# Patient Record
Sex: Female | Born: 1985 | Race: White | Hispanic: No | Marital: Married | Smoking: Never smoker
Health system: Southern US, Community
[De-identification: ages and names within clinical notes are randomized; demographics above are authoritative.]

## PROBLEM LIST (undated history)

## (undated) ENCOUNTER — Inpatient Hospital Stay: Payer: Self-pay

## (undated) ENCOUNTER — Inpatient Hospital Stay (HOSPITAL_COMMUNITY): Payer: Self-pay

## (undated) DIAGNOSIS — R519 Headache, unspecified: Secondary | ICD-10-CM

## (undated) DIAGNOSIS — K5900 Constipation, unspecified: Secondary | ICD-10-CM

## (undated) DIAGNOSIS — R51 Headache: Secondary | ICD-10-CM

## (undated) DIAGNOSIS — D649 Anemia, unspecified: Secondary | ICD-10-CM

## (undated) DIAGNOSIS — N979 Female infertility, unspecified: Secondary | ICD-10-CM

## (undated) DIAGNOSIS — K219 Gastro-esophageal reflux disease without esophagitis: Secondary | ICD-10-CM

## (undated) DIAGNOSIS — F32A Depression, unspecified: Secondary | ICD-10-CM

## (undated) DIAGNOSIS — F419 Anxiety disorder, unspecified: Secondary | ICD-10-CM

## (undated) DIAGNOSIS — O99210 Obesity complicating pregnancy, unspecified trimester: Secondary | ICD-10-CM

## (undated) DIAGNOSIS — N3090 Cystitis, unspecified without hematuria: Secondary | ICD-10-CM

## (undated) DIAGNOSIS — F329 Major depressive disorder, single episode, unspecified: Secondary | ICD-10-CM

## (undated) DIAGNOSIS — N200 Calculus of kidney: Secondary | ICD-10-CM

## (undated) HISTORY — DX: Gastro-esophageal reflux disease without esophagitis: K21.9

## (undated) HISTORY — PX: DILATION AND CURETTAGE OF UTERUS: SHX78

## (undated) HISTORY — PX: TONSILLECTOMY: SUR1361

## (undated) HISTORY — PX: OTHER SURGICAL HISTORY: SHX169

## (undated) HISTORY — DX: Depression, unspecified: F32.A

## (undated) HISTORY — DX: Major depressive disorder, single episode, unspecified: F32.9

## (undated) HISTORY — DX: Constipation, unspecified: K59.00

## (undated) HISTORY — PX: MYRINGOTOMY WITH TUBE PLACEMENT: SHX5663

## (undated) HISTORY — PX: ADENOIDECTOMY: SUR15

## (undated) HISTORY — DX: Calculus of kidney: N20.0

## (undated) HISTORY — DX: Cystitis, unspecified without hematuria: N30.90

## (undated) HISTORY — DX: Anemia, unspecified: D64.9

---

## 2009-07-16 ENCOUNTER — Ambulatory Visit: Payer: Self-pay | Admitting: Otolaryngology

## 2009-12-05 ENCOUNTER — Ambulatory Visit: Payer: Self-pay | Admitting: Otolaryngology

## 2010-11-12 ENCOUNTER — Ambulatory Visit
Admission: RE | Admit: 2010-11-12 | Discharge: 2010-11-12 | Disposition: A | Discharge status: Home / Self Care | Payer: PRIVATE HEALTH INSURANCE | Source: Ambulatory Visit | Attending: Internal Medicine | Admitting: Internal Medicine

## 2010-11-12 ENCOUNTER — Other Ambulatory Visit: Payer: Self-pay | Admitting: Internal Medicine

## 2010-11-12 DIAGNOSIS — M542 Cervicalgia: Secondary | ICD-10-CM

## 2011-01-09 ENCOUNTER — Other Ambulatory Visit: Payer: Self-pay | Admitting: *Deleted

## 2011-01-09 DIAGNOSIS — R1011 Right upper quadrant pain: Secondary | ICD-10-CM

## 2011-01-12 ENCOUNTER — Ambulatory Visit
Admission: RE | Admit: 2011-01-12 | Discharge: 2011-01-12 | Disposition: A | Discharge status: Home / Self Care | Payer: PRIVATE HEALTH INSURANCE | Source: Ambulatory Visit | Attending: *Deleted | Admitting: *Deleted

## 2011-01-12 DIAGNOSIS — R1011 Right upper quadrant pain: Secondary | ICD-10-CM

## 2011-02-17 ENCOUNTER — Other Ambulatory Visit (HOSPITAL_COMMUNITY)
Admission: RE | Admit: 2011-02-17 | Discharge: 2011-02-17 | Disposition: A | Discharge status: Home / Self Care | Payer: PRIVATE HEALTH INSURANCE | Source: Ambulatory Visit | Attending: Obstetrics and Gynecology | Admitting: Obstetrics and Gynecology

## 2011-02-17 DIAGNOSIS — Z113 Encounter for screening for infections with a predominantly sexual mode of transmission: Secondary | ICD-10-CM | POA: Insufficient documentation

## 2011-02-17 DIAGNOSIS — Z01419 Encounter for gynecological examination (general) (routine) without abnormal findings: Secondary | ICD-10-CM | POA: Insufficient documentation

## 2011-02-17 DIAGNOSIS — N760 Acute vaginitis: Secondary | ICD-10-CM | POA: Insufficient documentation

## 2012-03-24 ENCOUNTER — Other Ambulatory Visit (HOSPITAL_COMMUNITY)
Admission: RE | Admit: 2012-03-24 | Discharge: 2012-03-24 | Disposition: A | Discharge status: Home / Self Care | Payer: BC Managed Care – PPO | Source: Ambulatory Visit | Attending: Obstetrics and Gynecology | Admitting: Obstetrics and Gynecology

## 2012-03-24 DIAGNOSIS — N760 Acute vaginitis: Secondary | ICD-10-CM | POA: Insufficient documentation

## 2012-03-24 DIAGNOSIS — Z113 Encounter for screening for infections with a predominantly sexual mode of transmission: Secondary | ICD-10-CM | POA: Insufficient documentation

## 2012-03-24 DIAGNOSIS — Z01419 Encounter for gynecological examination (general) (routine) without abnormal findings: Secondary | ICD-10-CM | POA: Insufficient documentation

## 2012-07-01 ENCOUNTER — Other Ambulatory Visit: Payer: Self-pay | Admitting: Otolaryngology

## 2012-07-01 ENCOUNTER — Ambulatory Visit
Admission: RE | Admit: 2012-07-01 | Discharge: 2012-07-01 | Disposition: A | Discharge status: Home / Self Care | Payer: BC Managed Care – PPO | Source: Ambulatory Visit | Attending: Otolaryngology | Admitting: Otolaryngology

## 2012-07-01 DIAGNOSIS — H7200 Central perforation of tympanic membrane, unspecified ear: Secondary | ICD-10-CM

## 2012-07-01 DIAGNOSIS — H6980 Other specified disorders of Eustachian tube, unspecified ear: Secondary | ICD-10-CM

## 2012-07-01 MED ORDER — IOHEXOL 300 MG/ML  SOLN
75.0000 mL | Freq: Once | INTRAMUSCULAR | Status: AC | PRN
  Administered 2012-07-01: 75 mL via INTRAVENOUS

## 2012-07-25 ENCOUNTER — Encounter (INDEPENDENT_AMBULATORY_CARE_PROVIDER_SITE_OTHER): Payer: Self-pay

## 2012-12-01 ENCOUNTER — Encounter (INDEPENDENT_AMBULATORY_CARE_PROVIDER_SITE_OTHER): Payer: Self-pay

## 2013-10-03 ENCOUNTER — Ambulatory Visit
Admission: RE | Admit: 2013-10-03 | Discharge: 2013-10-03 | Disposition: A | Discharge status: Home / Self Care | Payer: 59 | Source: Ambulatory Visit | Attending: Internal Medicine | Admitting: Internal Medicine

## 2013-10-03 ENCOUNTER — Other Ambulatory Visit: Payer: Self-pay | Admitting: Internal Medicine

## 2013-10-03 DIAGNOSIS — R1013 Epigastric pain: Secondary | ICD-10-CM

## 2014-01-16 ENCOUNTER — Ambulatory Visit (INDEPENDENT_AMBULATORY_CARE_PROVIDER_SITE_OTHER): Payer: 59 | Admitting: *Deleted

## 2014-01-16 ENCOUNTER — Ambulatory Visit: Payer: 59

## 2014-01-16 DIAGNOSIS — R110 Nausea: Secondary | ICD-10-CM

## 2014-01-16 LAB — HCG, QUANTITATIVE, PREGNANCY: hCG, Beta Chain, Quant, S: 0.32 m[IU]/mL

## 2014-06-13 ENCOUNTER — Inpatient Hospital Stay (HOSPITAL_COMMUNITY)
Admission: AD | Admit: 2014-06-13 | Discharge: 2014-06-13 | Disposition: A | Discharge status: Home / Self Care | Payer: 59 | Source: Ambulatory Visit | Attending: Obstetrics & Gynecology | Admitting: Obstetrics & Gynecology

## 2014-06-13 ENCOUNTER — Other Ambulatory Visit: Payer: Self-pay | Admitting: General Surgery

## 2014-06-13 ENCOUNTER — Other Ambulatory Visit (INDEPENDENT_AMBULATORY_CARE_PROVIDER_SITE_OTHER): Payer: 59

## 2014-06-13 ENCOUNTER — Encounter (HOSPITAL_COMMUNITY): Payer: Self-pay | Admitting: *Deleted

## 2014-06-13 DIAGNOSIS — N939 Abnormal uterine and vaginal bleeding, unspecified: Secondary | ICD-10-CM

## 2014-06-13 DIAGNOSIS — N938 Other specified abnormal uterine and vaginal bleeding: Secondary | ICD-10-CM | POA: Diagnosis present

## 2014-06-13 DIAGNOSIS — R5383 Other fatigue: Secondary | ICD-10-CM

## 2014-06-13 DIAGNOSIS — N949 Unspecified condition associated with female genital organs and menstrual cycle: Secondary | ICD-10-CM | POA: Insufficient documentation

## 2014-06-13 DIAGNOSIS — N898 Other specified noninflammatory disorders of vagina: Secondary | ICD-10-CM

## 2014-06-13 DIAGNOSIS — R5381 Other malaise: Secondary | ICD-10-CM

## 2014-06-13 LAB — CBC
HEMATOCRIT: 37.7 % (ref 36.0–46.0)
Hemoglobin: 12.7 g/dL (ref 12.0–15.0)
MCHC: 33.6 g/dL (ref 30.0–36.0)
MCV: 83.9 fl (ref 78.0–100.0)
Platelets: 524 10*3/uL — ABNORMAL HIGH (ref 150.0–400.0)
RBC: 4.49 Mil/uL (ref 3.87–5.11)
RDW: 14.4 % (ref 11.5–15.5)
WBC: 8 10*3/uL (ref 4.0–10.5)

## 2014-06-13 LAB — URINALYSIS, ROUTINE W REFLEX MICROSCOPIC
BILIRUBIN URINE: NEGATIVE
Glucose, UA: NEGATIVE mg/dL
Ketones, ur: NEGATIVE mg/dL
Nitrite: NEGATIVE
Protein, ur: 30 mg/dL — AB
Specific Gravity, Urine: 1.01 (ref 1.005–1.030)
UROBILINOGEN UA: 0.2 mg/dL (ref 0.0–1.0)
pH: 6 (ref 5.0–8.0)

## 2014-06-13 LAB — URINE MICROSCOPIC-ADD ON

## 2014-06-13 LAB — POCT PREGNANCY, URINE: Preg Test, Ur: NEGATIVE

## 2014-06-13 MED ORDER — MEDROXYPROGESTERONE ACETATE 10 MG PO TABS: 10.0000 mg | ORAL_TABLET | Freq: Every day | ORAL | Status: DC

## 2014-06-13 NOTE — MAU Provider Note (Signed)
History     CSN: [id number]  Arrival date and time: 06/13/14 1611   None     Chief Complaint  Patient presents with  . Vaginal Bleeding   HPI  Ms. Kayla Shelton is a 28 y.o. female G0P0 who presents with abnormal vaginal bleeding. She has had off again and on again bleeding for 6 months. She has been bleeding for 2 weeks; some bleeding not always heavy. She is currently changing 2-3 pads per day. Not currently taking birth control however had been on birth control pills for 10 years. She desires pregnancy.  She has been seeing Dr. Nelda Marseille, however does not desires a change in GYN Dr.  She is scheduled on 11/4 with Dr. Julien Girt. She is concerned most about the length of time she has been bleeding; she desires pregnancy and wants to know if this will interfere with her getting pregnant.   CBC was done at another facility.   OB History   Grav Para Term Preterm Abortions TAB SAB Ect Mult Living                  History reviewed. No pertinent past medical history.  Past Surgical History  Procedure Laterality Date  . Tonsillectomy    . Hematoma on neck removed      No family history on file.  History  Substance Use Topics  . Smoking status: Never Smoker   . Smokeless tobacco: Not on file  . Alcohol Use: Yes     Comment: one drink a month    Allergies:  Allergies  Allergen Reactions  . Sulfa Antibiotics Swelling    Prescriptions prior to admission  Medication Sig Dispense Refill  . amoxicillin-clavulanate (AUGMENTIN) 875-125 MG per tablet Take 1 tablet by mouth 2 (two) times daily. Used for ear infection.  Medication completed on 06/09/14.      . calcium carbonate (TUMS - DOSED IN MG ELEMENTAL CALCIUM) 500 MG chewable tablet Chew 2 tablets by mouth daily as needed for indigestion or heartburn.      . cetirizine (ZYRTEC) 10 MG tablet Take 10 mg by mouth at bedtime.      . Chromium 200 MCG CAPS Take 2 capsules by mouth daily. Over the counter Dietary supplement.  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      Marland Kitchen GARCINIA CAMBOGIA-CHROMIUM PO Take 1 packet by mouth daily. 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      Marland Kitchen ibuprofen (ADVIL,MOTRIN) 200 MG tablet Take 600 mg by mouth every 6 (six) hours as needed for moderate pain.      . metFORMIN (GLUCOPHAGE) 500 MG tablet Take 500 mg by mouth daily with breakfast.      . naproxen sodium (ALEVE) 220 MG tablet Take 440 mg by mouth daily.       Results for orders placed during the hospital encounter of 06/13/14 (from the past 48 hour(s))  POCT PREGNANCY, URINE     Status: None   Collection Time    06/13/14  6:44 PM      Result Value Ref Range   Preg Test, Ur NEGATIVE  NEGATIVE   Comment:            THE SENSITIVITY OF THIS     METHODOLOGY IS >24 mIU/mL    Review of Systems  Constitutional: Negative for fever and chills.  Gastrointestinal: Positive for abdominal pain (Mild cramping ).   Physical Exam   Blood pressure 133/75, pulse 104, temperature 98.1 F (36.7  C), temperature source Oral, height 5\' 1"  (1.549 m), weight 114.363 kg (252 lb 2 oz).  Physical Exam  Constitutional: She is oriented to person, place, and time. She appears well-developed and well-nourished. No distress.  HENT:  Head: Normocephalic.  Eyes: Pupils are equal, round, and reactive to light.  Neck: Neck supple.  Respiratory: Effort normal.  GI: Soft.  Genitourinary:  Speculum exam: Vagina - Small amount of dark red blood in vaginal canal.  Cervix - +scant active bleeding  Bimanual exam: Cervix closed, no CMT  Uterus non tender, normal size Adnexa non tender, no masses bilaterally Chaperone present for exam.   Musculoskeletal: Normal range of motion.  Neurological: She is alert and oriented to person, place, and time.  Skin: Skin is warm. She is not diaphoretic.  Psychiatric: Her behavior is normal.    MAU Course  Procedures None  MDM UA CBC  Dr. Landry Mellow notified of physician request change.   Assessment and Plan    A: Abnormal vaginal bleeding   P: Discharge home in stable condition RX: Provera Bleeding precautions Return to MAU as needed if symptoms worsen  Darrelyn Hillock Debbi Strandberg, NP 06/13/2014 7:07 PM

## 2014-06-13 NOTE — MAU Note (Signed)
Pt states here for excessive bleeding. Changing pad twice daily. Does use restroom frequently and passes clots then. Passed softball sized clot last pm. Had u/s at Dr. Annie Main office, and had her employer draw cbc today.

## 2014-06-13 NOTE — MAU Note (Signed)
Pt states that vaginal bleeding started 9/17. Irregular bleeding started about a month ago. Started on metformin Friday. Possible PCOS, had a transvaginal ultrasound on 9/24. Passing some clots (varying in size from quarter to small strings). Changing a pad about 2-3 times a day.

## 2014-06-13 NOTE — Discharge Instructions (Signed)
Abnormal Uterine Bleeding Abnormal uterine bleeding can affect women at various stages in life, including teenagers, women in their reproductive years, pregnant women, and women who have reached menopause. Several kinds of uterine bleeding are considered abnormal, including:  Bleeding or spotting between periods.   Bleeding after sexual intercourse.   Bleeding that is heavier or more than normal.   Periods that last longer than usual.  Bleeding after menopause.  Many cases of abnormal uterine bleeding are minor and simple to treat, while others are more serious. Any type of abnormal bleeding should be evaluated by your health care provider. Treatment will depend on the cause of the bleeding. HOME CARE INSTRUCTIONS Monitor your condition for any changes. The following actions may help to alleviate any discomfort you are experiencing:  Avoid the use of tampons and douches as directed by your health care provider.  Change your pads frequently. You should get regular pelvic exams and Pap tests. Keep all follow-up appointments for diagnostic tests as directed by your health care provider.  SEEK MEDICAL CARE IF:   Your bleeding lasts more than 1 week.   You feel dizzy at times.  SEEK IMMEDIATE MEDICAL CARE IF:   You pass out.   You are changing pads every 15 to 30 minutes.   You have abdominal pain.  You have a fever.   You become sweaty or weak.   You are passing large blood clots from the vagina.   You start to feel nauseous and vomit. MAKE SURE YOU:   Understand these instructions.  Will watch your condition.  Will get help right away if you are not doing well or get worse. Document Released: 08/31/2005 Document Revised: 09/05/2013 Document Reviewed: 03/30/2013 ExitCare Patient Information 2015 [employer]. This information is not intended to replace advice given to you by your health care provider. Make sure you discuss any questions you have with your  health care provider.  

## 2014-07-23 DIAGNOSIS — N939 Abnormal uterine and vaginal bleeding, unspecified: Secondary | ICD-10-CM | POA: Insufficient documentation

## 2014-08-22 DIAGNOSIS — N970 Female infertility associated with anovulation: Secondary | ICD-10-CM | POA: Insufficient documentation

## 2014-09-14 NOTE — L&D Delivery Note (Signed)
This patient has no babies on file. SVD at 2058 breech . No signs of life . With pushing and traction fetal head decapitated . Shortly thereafter head delivered . Marland KitchenPlacenta with delayed delivery . RN avulsed cord with traction . Speculum exam with removal of fragmented placenta pieces .PT will go to [facility] OR for evacuation of uterus for retained placenta

## 2014-11-22 ENCOUNTER — Emergency Department: Payer: Self-pay | Admitting: Emergency Medicine

## 2015-04-05 ENCOUNTER — Other Ambulatory Visit: Payer: Self-pay | Admitting: Obstetrics and Gynecology

## 2015-04-05 DIAGNOSIS — Z369 Encounter for antenatal screening, unspecified: Secondary | ICD-10-CM

## 2015-04-22 ENCOUNTER — Ambulatory Visit (HOSPITAL_BASED_OUTPATIENT_CLINIC_OR_DEPARTMENT_OTHER)
Admission: RE | Admit: 2015-04-22 | Discharge: 2015-04-22 | Disposition: A | Discharge status: Home / Self Care | Payer: BLUE CROSS/BLUE SHIELD | Source: Ambulatory Visit | Attending: Obstetrics and Gynecology | Admitting: Obstetrics and Gynecology

## 2015-04-22 ENCOUNTER — Ambulatory Visit
Admission: RE | Admit: 2015-04-22 | Discharge: 2015-04-22 | Disposition: A | Discharge status: Home / Self Care | Payer: BLUE CROSS/BLUE SHIELD | Source: Ambulatory Visit

## 2015-04-22 ENCOUNTER — Ambulatory Visit
Admission: RE | Admit: 2015-04-22 | Discharge: 2015-04-22 | Disposition: A | Discharge status: Home / Self Care | Payer: BLUE CROSS/BLUE SHIELD | Source: Ambulatory Visit | Attending: Obstetrics and Gynecology | Admitting: Obstetrics and Gynecology

## 2015-04-22 VITALS — BP 115/88 | HR 124 | Wt 232.4 lb

## 2015-04-22 DIAGNOSIS — Z36 Encounter for antenatal screening of mother: Secondary | ICD-10-CM | POA: Diagnosis not present

## 2015-04-22 DIAGNOSIS — Z369 Encounter for antenatal screening, unspecified: Secondary | ICD-10-CM

## 2015-04-22 HISTORY — DX: Obesity complicating pregnancy, unspecified trimester: O99.210

## 2015-04-22 HISTORY — DX: Anxiety disorder, unspecified: F41.9

## 2015-04-22 LAB — US OB COMP LESS 14 WKS

## 2015-04-22 NOTE — Progress Notes (Signed)
Referring physician:  [facility] Length of Consultation: 30 minute consultation   Kayla Shelton  was referred to [facility] for genetic counseling to review prenatal screening and testing options.  This note summarizes the information we discussed.    The following screening tests were offered:  First trimester screening, which can include nuchal translucency ultrasound screen and/or first trimester maternal serum marker screening.  The nuchal translucency has approximately an 80% detection rate for Down syndrome and can be positive for other chromosome abnormalities as well as congenital heart defects.  When combined with a maternal serum marker screening, the detection rate is up to 90% for Down syndrome and up to 97% for trisomy 18.     Maternal serum marker screening, a blood test that measures pregnancy proteins, can provide risk assessments for Down syndrome, trisomy 18, and open neural tube defects (spina bifida, anencephaly). Because it does not directly examine the fetus, it cannot positively diagnose or rule out these problems.  Targeted ultrasound uses high frequency sound waves to create an image of the developing fetus.  An ultrasound is often recommended as a routine means of evaluating the pregnancy.  It is also used to screen for fetal anatomy problems (for example, a heart defect) that might be suggestive of a chromosomal or other abnormality.   Should these screening tests indicate an increased concern, then the following diagnostic options would be offered:  The chorionic villus sampling procedure is available for first trimester chromosome analysis.  This involves the withdrawal of a small amount of chorionic villi (tissue from the developing placenta).  Risk of pregnancy loss is estimated to be approximately 1 in 200 to 1 in 100 (0.5 to 1%).  There is approximately a 1% (1 in 100) chance that the CVS chromosome results will be unclear.  Chorionic  villi cannot be tested for neural tube defects.     Amniocentesis involves the removal of a small amount of amniotic fluid from the sac surrounding the fetus with the use of a thin needle inserted through the maternal abdomen and uterus.  Ultrasound guidance is used throughout the procedure.  Fetal cells from amniotic fluid are directly evaluated and > 99.5% of chromosome problems and > 98% of open neural tube defects can be detected. This procedure is generally performed after the 15th week of pregnancy.  The main risks to this procedure include complications leading to miscarriage in less than 1 in 200 cases (0.5%).  As another option for information if the pregnancy is suspected to be an an increased chance for certain chromosome conditions, we also reviewed the availability of cell free fetal DNA testing from maternal blood to determine whether or not the baby may have either Down syndrome, trisomy 60, or trisomy 53.  This test utilizes a maternal blood sample and DNA sequencing technology to isolate circulating cell free fetal DNA from maternal plasma.  The fetal DNA can then be analyzed for DNA sequences that are derived from the three most common chromosomes involved in aneuploidy, chromosomes 13, 18, and 21.  If the overall amount of DNA is greater than the expected level for any of these chromosomes, aneuploidy is suspected.  While we do not consider it a replacement for invasive testing and karyotype analysis, a negative result from this testing would be reassuring, though not a guarantee of a normal chromosome complement for the baby.  An abnormal result is certainly suggestive of an abnormal chromosome complement, though we would still  recommend CVS or amniocentesis to confirm any findings from this testing.  Cystic Fibrosis screening was also discussed with the patient. Cystic fibrosis (CF) is one of the most common genetic conditions in persons of Caucasian ancestry.  This condition occurs in  approximately 1 in 2,500 Caucasian persons and results in thickened secretions in the lungs, digestive, and reproductive systems.  For a baby to be at risk for having CF, both of the parents must be carriers for this condition.  Approximately 1 in 46 Caucasian persons is a carrier for CF.  Current carrier testing looks for the most common mutations in the gene for CF and can detect approximately 90% of carriers in the Caucasian population.  This means that the carrier screening can greatly reduce, but cannot eliminate, the chance for an individual to have a child with CF.  If an individual is found to be a carrier for CF, then carrier testing would be available for the partner. As part of [place] newborn screening profile, all babies born in the state of [place] will have a two-tier screening process.  Specimens are first tested to determine the concentration of immunoreactive trypsinogen (IRT).  The top 5% of specimens with the highest IRT values then undergo DNA testing using a panel of over 40 common CF mutations.   We obtained a detailed family history and pregnancy history. The patient stated that her mother has a history of depression and her father has cardiovascular disease, hypertension and type 2 diabetes.  Similarly, the father of the baby stated that his mother has diabetes and hypertension.  His mother and maternal uncle both have bipolar disorder.  We reviewed that mental health conditions may have strong inherited factors in some families, though the genetic factors are not well understood.  Cardiovascular disease, hypertension and diabetes also have strong inherited components as well as lifestyle factors.  All of these are good to be aware of, but currently no genetic testing is available for them. The remainder of the family history was reported to be unremarkable for birth defects, mental retardation, recurrent pregnancy loss or known chromosome abnormalities.  Kayla Shelton  reported that this is her first pregnancy.  She has had no exposures or complications in this pregnancy that would be expected to increase the risk for birth defects.    After consideration of the options, Kayla Shelton elected to proceed with an ultrasound and to decline formal first trimester screening, CF carrier screening and maternal serum AFP testing for spina bifida.  She desires ultrasound only.  An ultrasound was performed at the time of the visit.  The gestational age was consistent with  12 weeks.  Fetal anatomy could not be assessed due to early gestational age.  Please refer to the ultrasound report for details of that study.  Ms. Basich was encouraged to call with questions or concerns.  We can be contacted at [phone].  Wilburt Finlay, MS, CGC

## 2015-04-23 DIAGNOSIS — Z36 Encounter for antenatal screening of mother: Secondary | ICD-10-CM | POA: Diagnosis not present

## 2015-04-23 DIAGNOSIS — Z3A12 12 weeks gestation of pregnancy: Secondary | ICD-10-CM

## 2015-04-23 NOTE — Progress Notes (Signed)
Kayla Wells, MS, CGC performed an integral service incident to the physician's initial service.  I was physically present in the clinical area and was immediately available to render assistance.   [name] C [name]  

## 2015-06-20 ENCOUNTER — Ambulatory Visit (HOSPITAL_COMMUNITY)
Admission: AD | Admit: 2015-06-20 | Discharge: 2015-06-20 | Disposition: A | Discharge status: Home / Self Care | Payer: BLUE CROSS/BLUE SHIELD | Source: Other Acute Inpatient Hospital | Attending: Obstetrics and Gynecology | Admitting: Obstetrics and Gynecology

## 2015-06-20 ENCOUNTER — Encounter: Payer: Self-pay | Admitting: *Deleted

## 2015-06-20 ENCOUNTER — Observation Stay
Admission: AD | Admit: 2015-06-20 | Discharge: 2015-06-20 | Disposition: A | Discharge status: Nursing Home (Includes ICF) | Payer: BLUE CROSS/BLUE SHIELD | Attending: Obstetrics and Gynecology | Admitting: Obstetrics and Gynecology

## 2015-06-20 DIAGNOSIS — O99342 Other mental disorders complicating pregnancy, second trimester: Secondary | ICD-10-CM | POA: Diagnosis not present

## 2015-06-20 DIAGNOSIS — F28 Other psychotic disorder not due to a substance or known physiological condition: Secondary | ICD-10-CM | POA: Diagnosis not present

## 2015-06-20 DIAGNOSIS — O2690 Pregnancy related conditions, unspecified, unspecified trimester: Secondary | ICD-10-CM | POA: Insufficient documentation

## 2015-06-20 DIAGNOSIS — O98312 Other infections with a predominantly sexual mode of transmission complicating pregnancy, second trimester: Secondary | ICD-10-CM | POA: Insufficient documentation

## 2015-06-20 DIAGNOSIS — O99212 Obesity complicating pregnancy, second trimester: Secondary | ICD-10-CM | POA: Diagnosis not present

## 2015-06-20 DIAGNOSIS — F419 Anxiety disorder, unspecified: Secondary | ICD-10-CM | POA: Diagnosis not present

## 2015-06-20 DIAGNOSIS — O034 Incomplete spontaneous abortion without complication: Secondary | ICD-10-CM | POA: Insufficient documentation

## 2015-06-20 DIAGNOSIS — E282 Polycystic ovarian syndrome: Secondary | ICD-10-CM | POA: Diagnosis not present

## 2015-06-20 DIAGNOSIS — O209 Hemorrhage in early pregnancy, unspecified: Secondary | ICD-10-CM | POA: Diagnosis present

## 2015-06-20 DIAGNOSIS — O3432 Maternal care for cervical incompetence, second trimester: Secondary | ICD-10-CM | POA: Diagnosis present

## 2015-06-20 DIAGNOSIS — F329 Major depressive disorder, single episode, unspecified: Secondary | ICD-10-CM | POA: Insufficient documentation

## 2015-06-20 DIAGNOSIS — A6009 Herpesviral infection of other urogenital tract: Secondary | ICD-10-CM | POA: Diagnosis not present

## 2015-06-20 DIAGNOSIS — Z6841 Body Mass Index (BMI) 40.0 and over, adult: Secondary | ICD-10-CM | POA: Diagnosis not present

## 2015-06-20 DIAGNOSIS — Z3A20 20 weeks gestation of pregnancy: Secondary | ICD-10-CM | POA: Insufficient documentation

## 2015-06-20 DIAGNOSIS — O9989 Other specified diseases and conditions complicating pregnancy, childbirth and the puerperium: Secondary | ICD-10-CM | POA: Diagnosis not present

## 2015-06-20 DIAGNOSIS — Z3A00 Weeks of gestation of pregnancy not specified: Secondary | ICD-10-CM | POA: Insufficient documentation

## 2015-06-20 DIAGNOSIS — O039 Complete or unspecified spontaneous abortion without complication: Secondary | ICD-10-CM

## 2015-06-20 LAB — CHLAMYDIA/NGC RT PCR (ARMC ONLY)
Chlamydia Tr: NOT DETECTED
N GONORRHOEAE: NOT DETECTED

## 2015-06-20 LAB — URINALYSIS COMPLETE WITH MICROSCOPIC (ARMC ONLY)
BACTERIA UA: NONE SEEN
Bilirubin Urine: NEGATIVE
Glucose, UA: 50 mg/dL — AB
Hgb urine dipstick: NEGATIVE
LEUKOCYTES UA: NEGATIVE
NITRITE: NEGATIVE
PH: 6 (ref 5.0–8.0)
PROTEIN: NEGATIVE mg/dL
RBC / HPF: NONE SEEN RBC/hpf (ref 0–5)
SPECIFIC GRAVITY, URINE: 1.019 (ref 1.005–1.030)

## 2015-06-20 LAB — CBC
HEMATOCRIT: 32.7 % — AB (ref 35.0–47.0)
HEMOGLOBIN: 10.9 g/dL — AB (ref 12.0–16.0)
MCH: 28.2 pg (ref 26.0–34.0)
MCHC: 33.5 g/dL (ref 32.0–36.0)
MCV: 84 fL (ref 80.0–100.0)
Platelets: 340 10*3/uL (ref 150–440)
RBC: 3.89 MIL/uL (ref 3.80–5.20)
RDW: 16.3 % — ABNORMAL HIGH (ref 11.5–14.5)
WBC: 13.7 10*3/uL — AB (ref 3.6–11.0)

## 2015-06-20 LAB — WET PREP, GENITAL
CLUE CELLS WET PREP: NONE SEEN
Trich, Wet Prep: NONE SEEN
Yeast Wet Prep HPF POC: NONE SEEN

## 2015-06-20 MED ORDER — HYDROMORPHONE HCL 1 MG/ML IJ SOLN
0.1000 mg | Freq: Once | INTRAMUSCULAR | Status: AC
  Administered 2015-06-20: 0.1 mg via INTRAVENOUS
  Filled 2015-06-20: qty 1

## 2015-06-20 MED ORDER — LACTATED RINGERS IV SOLN
INTRAVENOUS | Status: DC
  Administered 2015-06-20: 19:00:00 via INTRAVENOUS

## 2015-06-20 MED ORDER — SODIUM CHLORIDE 0.9 % IV SOLN
8.0000 mg | Freq: Once | INTRAVENOUS | Status: DC
  Filled 2015-06-20: qty 4

## 2015-06-20 MED ORDER — HYDROMORPHONE HCL 1 MG/ML IJ SOLN
INTRAMUSCULAR | Status: AC
  Filled 2015-06-20: qty 1

## 2015-06-20 MED ORDER — ONDANSETRON HCL 4 MG/2ML IJ SOLN
INTRAMUSCULAR | Status: AC
  Administered 2015-06-20: 4 mg
  Filled 2015-06-20: qty 4

## 2015-06-20 MED ORDER — HYDROMORPHONE HCL 1 MG/ML IJ SOLN: 0.3000 mg | Freq: Once | INTRAMUSCULAR | Status: DC

## 2015-06-20 MED ORDER — HYDROMORPHONE HCL 1 MG/ML IJ SOLN: 0.2000 mg | Freq: Once | INTRAMUSCULAR | Status: DC

## 2015-06-20 MED ORDER — HYDROMORPHONE HCL 1 MG/ML IJ SOLN: 0.2000 mg | Freq: Once | INTRAMUSCULAR | Status: DC | PRN

## 2015-06-20 NOTE — Progress Notes (Signed)
Kayla Shelton is a 29 y.o. G1P0 at [redacted]w[redacted]d by LMP of  01/25/15 and consistent with 1st trimester ultrasound admitted for advanced cervical dilation. Pregnancy conceived on letrozole for PCOS w/ obesity.   Pt seen in clinic today with reports of mild cramping this week and new onset pink spotting today with discharge. No fever, abdominal pain.   On exam, amniotic membranes noted at the cervical os. Cervix was 8-9 cm dilated visually. Dark red blood in vaginal vault. Fetal hearttones by bedside ultrasound 155 bpm.  Bedside abdominal ultrasound with prolapsing membranes, breech presentation, no cervix noted.   Pregnancy complicated by: Obesity: BMI 44. PCOS infertility: Pregnancy conceived with letrozole.  Past Medical History: has a past medical history of Anemia; Anxiety; Depression; Dysmenorrhea; Dyspareunia; and HSV-1 (herpes simplex virus 1) infection. 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 Past Surgical History: has past surgical history that includes Tonsillectomy & Adenoidectomy. Family History: family history includes COPD in her paternal grandmother; Dementia in her maternal grandmother; Diabetes mellitus in her father; Heart disease in her maternal grandfather and paternal grandfather; Hyperlipidemia in her brother, father, and paternal grandmother; Hypertension in her father and maternal grandmother. Social History: reports that she has never smoked. She has never used smokeless tobacco. She reports that she drinks alcohol.    Objective: BP 120/67 mmHg  Pulse 118  Resp 98  Ht 5\' 1"  (1.549 m)  Wt 107.502 kg (237 lb)  BMI 44.80 kg/m2  LMP 01/25/2015     Prenatal labs:  Maternal blood type: A pos  Ab screen: Neg  Pap ascus hpv neg  HIV: Neg  Hep B/RPR Neg/NR Rubella : Immune  VZV : Immune  First trimester Genetic counseling and U/S , declined the 1st trimester screening.04/22/15 Second trimester  (AFP/tetra): Declined, IE:[id number]  Early glucola normal  Cervical exam:  UC:  q3-4 min cramping SVE:   Dilation: 8 Effacement (%): 100 Station: -5 Exam by:: by Dr Leafy Ro in office   Labs: Lab Results  Component Value Date   WBC 8.0 06/13/2014   HGB 12.7 06/13/2014   HCT 37.7 06/13/2014   MCV 83.9 06/13/2014   PLT 524.0* 06/13/2014   Assessment / Plan:  Advanced cervical dilation: Discussion with patient and family included joint decision making, including previable state and high likelihood of having a periviable delivery even if rescue cerclage able to be placed, with no change of survival with delivery at this gestational age. Pt would like to try for heroic measures, while being realistic about the poor outcome. No steroids, mag or tocolytics started.  Vaginal cultures, including ureaplasma, mycoplasma, GC/CT, GBS and wet mount collected. IVF at 170ml/hr CBC pending. Urine culture pending.  Discussed transfer with [facility] at [facility] and MFM fellow to accept transfer Dr. Olene Floss.   Pain: Dilaudid 0.1mg  iv given.   Kayla Shelton 06/20/2015, 7:06 PM

## 2015-06-20 NOTE — OB Triage Note (Signed)
Patient sent from [facility] for transport to [facility] for advanced cervical dilation and bulging membranes. Transport accepted by Dr. Milas Kocher.

## 2015-06-21 ENCOUNTER — Encounter (HOSPITAL_COMMUNITY): Payer: Self-pay | Admitting: *Deleted

## 2015-06-21 ENCOUNTER — Inpatient Hospital Stay (HOSPITAL_COMMUNITY)
Admission: AD | Admit: 2015-06-21 | Discharge: 2015-06-21 | Disposition: A | Discharge status: Home / Self Care | Payer: BLUE CROSS/BLUE SHIELD | Source: Ambulatory Visit | Attending: Obstetrics & Gynecology | Admitting: Obstetrics & Gynecology

## 2015-06-21 DIAGNOSIS — O3430 Maternal care for cervical incompetence, unspecified trimester: Secondary | ICD-10-CM | POA: Diagnosis not present

## 2015-06-21 DIAGNOSIS — O4702 False labor before 37 completed weeks of gestation, second trimester: Secondary | ICD-10-CM | POA: Diagnosis not present

## 2015-06-21 DIAGNOSIS — O3432 Maternal care for cervical incompetence, second trimester: Secondary | ICD-10-CM | POA: Diagnosis not present

## 2015-06-21 DIAGNOSIS — Z3A21 21 weeks gestation of pregnancy: Secondary | ICD-10-CM | POA: Diagnosis not present

## 2015-06-21 DIAGNOSIS — N883 Incompetence of cervix uteri: Secondary | ICD-10-CM | POA: Diagnosis present

## 2015-06-21 HISTORY — DX: Female infertility, unspecified: N97.9

## 2015-06-21 NOTE — MAU Note (Signed)
Pt was seen in [facility] last night, was transported to [facility] for incompetent cervix with membranes "coming out."  Pt was DC'd home today, but is uncomfortable with that decision.  Pt has intermittent waves of abd pain, has light bleeding & some ? LOF.

## 2015-06-21 NOTE — MAU Provider Note (Signed)
History     CSN: [id number]  Arrival date and time: 06/21/15 1508   None     Chief Complaint  Patient presents with  . incompetent cervix    HPI  Kayla Shelton 29 y.o. G1P0 @ [redacted]w[redacted]d presents to the MAU for 2nd opinion of hourglassing membranes and management after being seen at [facility]. She currently receives care at the [facility] in [place]   Past Medical History  Diagnosis Date  . Obesity affecting pregnancy   . Anxiety   . Infertility, female     Past Surgical History  Procedure Laterality Date  . Tonsillectomy    . Hematoma on neck removed    . Adenoidectomy      History reviewed. No pertinent family history.  Social History  Substance Use Topics  . Smoking status: Never Smoker   . Smokeless tobacco: Never Used  . Alcohol Use: No     Comment: discontinued with pregnancy, prior to pregnancy would drink socially    Allergies:  Allergies  Allergen Reactions  . Sulfa Antibiotics Swelling    No prescriptions prior to admission    Review of Systems  Constitutional: Negative for fever.  Gastrointestinal: Negative for abdominal pain.  All other systems reviewed and are negative.  Physical Exam   Blood pressure 113/79, pulse 109, temperature 97.9 F (36.6 C), temperature source Oral, resp. rate 20, last menstrual period 01/25/2015.  Physical Exam  Nursing note and vitals reviewed. Constitutional: She is oriented to person, place, and time. She appears well-developed and well-nourished. No distress.  HENT:  Head: Normocephalic.  Neck: Normal range of motion. Neck supple.  Cardiovascular: Normal rate.   Respiratory: Effort normal. No respiratory distress.  GI: Soft. There is no tenderness.  Musculoskeletal: Normal range of motion. She exhibits no edema or tenderness.  Neurological: She is alert and oriented to person, place, and time.  Skin: Skin is warm and dry.  Psychiatric: She has a normal mood and affect. Her behavior is normal.  Judgment and thought content normal.    MAU Course  Procedures  MDM Dr Harolyn Rutherford notified of pt and she came to speak with her in great detail regarding treatment at this gestational age. Pt will be discharged to home  Assessment and Plan  Advanced cervical dilation/ Threatened preterm delivery  Discharge to home on bedrest. Will follow up with providers at [facility].  Clemmons,Lori Eustis, CNM 06/21/2015, 4:47 PM   Attestation of Attending Supervision of Advanced Practice Provider (PA/CNM/NP): Evaluation and management procedures were performed by the Advanced Practice Provider under my supervision and collaboration.  I have reviewed the Advanced Practice Provider's note and chart, and I agree with the management and plan.  Patient has been observed at [facility] and at evaluated [facility]; she was told the fetus was previable and there were no fetal interventions that could be done.  She was told to return to hospital in one week if still pregnant and may get steroids at that point given that the fetus may be considered periviable then.  Patient was told to be on modified bedrest at home, came here for second opinion. Given that she had already been observed in hospital for 2 days with no progressing PTL, she was told that we are not able to give her a different plan of care from what was established by [facility].  Of note, patient was given the choice of IOL, she declined this at [facility].  Patient was tearful; her family members  who accompanied her were very supportive.  She was discharged to home on modified bedrest and given PTL, bleeding and infection precautions. She will follow up at [facility] clinic or [facility] as previously planned.   Verita Schneiders, MD, [place] Attending [place], [facility]

## 2015-06-21 NOTE — Discharge Instructions (Signed)
Pelvic Rest Pelvic rest is sometimes recommended for women when:   The placenta is partially or completely covering the opening of the cervix (placenta previa).  There is bleeding between the uterine wall and the amniotic sac in the first trimester (subchorionic hemorrhage).  The cervix begins to open without labor starting (incompetent cervix, cervical insufficiency).  The labor is too early (preterm labor). HOME CARE INSTRUCTIONS  Do not have sexual intercourse, stimulation, or an orgasm.  Do not use tampons, douche, or put anything in the vagina.  Do not lift anything over 10 pounds (4.5 kg).  Avoid strenuous activity or straining your pelvic muscles. SEEK MEDICAL CARE IF:  You have any vaginal bleeding during pregnancy. Treat this as a potential emergency.  You have cramping pain felt low in the stomach (stronger than menstrual cramps).  You notice vaginal discharge (watery, mucus, or bloody).  You have a low, dull backache.  There are regular contractions or uterine tightening. SEEK IMMEDIATE MEDICAL CARE IF: You have vaginal bleeding and have placenta previa.    This information is not intended to replace advice given to you by your health care provider. Make sure you discuss any questions you have with your health care provider.   Document Released: 12/26/2010 Document Revised: 11/23/2011 Document Reviewed: 03/04/2015 Elsevier Interactive Patient Education 2016 Elsevier Inc.  Cervical Insufficiency Cervical insufficiency is when the cervix is weak and starts to open (dilate) and thin (efface) before the pregnancy is at term and without labor starting. This is also called incompetent cervix. It can happen in the second or third trimester when the fetus starts putting pressure on the cervix. Cervical insufficiency can lead to a miscarriage, preterm premature rupture of the membranes (PPROM), or having the baby early (preterm birth).  RISK FACTORS You may be more likely  to develop cervical insufficiency if:  You have a shorter cervix than normal.  Damage or injury occurred to your cervix from a past pregnancy or surgery.  You were born with a cervical defect.  You have had a procedure done on the cervix, such as cervical biopsy.  You have a history of cervical insufficiency.  You have a history of PPROM.  You have ended several past pregnancies through abortion.  You were exposed to the drug diethylstilbestrol (DES). SYMPTOMS Often times, women do not have any symptoms. Other times, women may only have mild symptoms that often start between week 14 through 20. The symptoms may last several days or weeks. These symptoms include:  Light spotting or bleeding from the vagina.  Pelvic pressure.  A change in vaginal discharge, such as discharge that changes from clear, white, or light yellow to pink or tan.  Back pain.  Abdominal pain or cramping. DIAGNOSIS Cervical insufficiency cannot be diagnosed before you become pregnant. Once you are pregnant, your health care provider will ask about your medical history and if you have had any problems in past pregnancies. Tell your health care provider about any procedures performed on your cervix or if you have a history of miscarriages or cervical insufficiency. If your health care provider thinks you are at high risk for cervical insufficiency or show signs of cervical insufficiency, he or she may:  Perform a pelvic exam. This will check for:  The presence of the membranes (amniotic sac) coming out of the cervix.  Cervical abnormalities.  Cervical injuries.  The presence of contractions.  Perform an ultrasonography (commonly called ultrasound) to measure the length and thickness of the cervix. TREATMENT  If you have been diagnosed with cervical insufficiency, your health care provider may recommend:  Limiting physical activity.  Bed rest at home or in the hospital.  Pelvic rest, which means no  sexual intercourse or placing anything in the vagina.  Cerclage to sew the cervix closed and prevent it from opening too early. The stitches (sutures) are removed between weeks 36 and 38 to avoid problems during labor. Cerclage may be recommended during pregnancy if you have had a history of miscarriages or preterm births without a known cause. It may also be recommended if you have a short cervix that was identified by ultrasound or if your health care provider has found that your cervix has dilated before 24 weeks of pregnancy. Limiting physical activity and bed rest may or may not help prevent a preterm birth. WHEN SHOULD YOU SEEK IMMEDIATE MEDICAL CARE?  Seek immediate medical care if you show any symptoms of cervical insufficiency. You will need to go to the hospital to get checked immediately.   This information is not intended to replace advice given to you by your health care provider. Make sure you discuss any questions you have with your health care provider.   Document Released: 08/31/2005 Document Revised: 09/21/2014 Document Reviewed: 11/07/2012 Elsevier Interactive Patient Education [employer].

## 2015-06-22 ENCOUNTER — Observation Stay
Admission: EM | Admit: 2015-06-22 | Discharge: 2015-06-23 | Disposition: A | Discharge status: Home / Self Care | Payer: BLUE CROSS/BLUE SHIELD | Attending: Obstetrics and Gynecology | Admitting: Obstetrics and Gynecology

## 2015-06-22 DIAGNOSIS — O4702 False labor before 37 completed weeks of gestation, second trimester: Principal | ICD-10-CM | POA: Insufficient documentation

## 2015-06-22 DIAGNOSIS — E669 Obesity, unspecified: Secondary | ICD-10-CM | POA: Insufficient documentation

## 2015-06-22 DIAGNOSIS — K5900 Constipation, unspecified: Secondary | ICD-10-CM | POA: Insufficient documentation

## 2015-06-22 DIAGNOSIS — Z3A21 21 weeks gestation of pregnancy: Secondary | ICD-10-CM | POA: Insufficient documentation

## 2015-06-22 DIAGNOSIS — O26872 Cervical shortening, second trimester: Secondary | ICD-10-CM | POA: Insufficient documentation

## 2015-06-22 DIAGNOSIS — F419 Anxiety disorder, unspecified: Secondary | ICD-10-CM | POA: Insufficient documentation

## 2015-06-22 DIAGNOSIS — O3432 Maternal care for cervical incompetence, second trimester: Secondary | ICD-10-CM | POA: Diagnosis present

## 2015-06-22 MED ORDER — DOCUSATE SODIUM 100 MG PO CAPS
100.0000 mg | ORAL_CAPSULE | Freq: Two times a day (BID) | ORAL | Status: DC | PRN
  Administered 2015-06-22: 100 mg via ORAL
  Filled 2015-06-22: qty 1

## 2015-06-22 MED ORDER — DIPHENHYDRAMINE HCL 25 MG PO CAPS
ORAL_CAPSULE | ORAL | Status: AC
  Administered 2015-06-22: 25 mg
  Filled 2015-06-22: qty 2

## 2015-06-22 MED ORDER — ACETAMINOPHEN 325 MG PO TABS: 650.0000 mg | ORAL_TABLET | ORAL | Status: DC | PRN

## 2015-06-22 MED ORDER — ZOLPIDEM TARTRATE 5 MG PO TABS: 5.0000 mg | ORAL_TABLET | Freq: Every evening | ORAL | Status: DC | PRN

## 2015-06-22 MED ORDER — DOCUSATE SODIUM 100 MG PO CAPS: 100.0000 mg | ORAL_CAPSULE | Freq: Every day | ORAL | Status: DC

## 2015-06-22 MED ORDER — CALCIUM CARBONATE ANTACID 500 MG PO CHEW: 2.0000 | CHEWABLE_TABLET | ORAL | Status: DC | PRN

## 2015-06-22 MED ORDER — PRENATAL MULTIVITAMIN CH: 1.0000 | ORAL_TABLET | Freq: Every day | ORAL | Status: DC

## 2015-06-22 MED ORDER — DIPHENHYDRAMINE HCL 25 MG PO CAPS
50.0000 mg | ORAL_CAPSULE | Freq: Four times a day (QID) | ORAL | Status: DC | PRN
  Administered 2015-06-22: 25 mg via ORAL

## 2015-06-22 NOTE — OB Triage Provider Note (Addendum)
History     CSN: [id number]  Arrival date and time: 06/22/15 1426   None     No chief complaint on file.  HPI Kayla Shelton is a 29 yo, Caucasian female, G1P0 at 21+1 weeks by exact LMP of 01/25/2015. Pt. Receives prenatal care at [facility] and this pregnancy was conceived with Letrozol for history of PCOS related infertility and obesity.  Pt.was seen at [facility] with mild cramping and spotting on 06/20/15 with findings of advanced cervical dilatation and sent to [facility] for evaluation.  Pt. discharged from [facility] on 06/21/15 with a cervical exam of 3-4cm and hourglass membranes per MFM and given preterm labor precautions.  MFM declared her cervical exam too advanced for a rescue cerclage.  Today, pt. Called on-call midwife with c/o of more pressure and "feeling like something is coming out of her vagina" when she went to the bathroom, and was instructed to come to [facility] for evaluation.    OB History    Gravida Para Term Preterm AB TAB SAB Ectopic Multiple Living   1 0              Past Medical History  Diagnosis Date  . Obesity affecting pregnancy   . Anxiety   . Infertility, female     Past Surgical History  Procedure Laterality Date  . Tonsillectomy    . Hematoma on neck removed    . Adenoidectomy      No family history on file.  Social History  Substance Use Topics  . Smoking status: Never Smoker   . Smokeless tobacco: Never Used  . Alcohol Use: No     Comment: discontinued with pregnancy, prior to pregnancy would drink socially    Allergies:  Allergies  Allergen Reactions  . Sulfa Antibiotics Swelling    Prescriptions prior to admission  Medication Sig Dispense Refill Last Dose  . acetaminophen (TYLENOL) 500 MG tablet Take 1,000 mg by mouth every 6 (six) hours as needed for mild pain or headache.   06/20/2015 at Unknown time  . calcium carbonate (TUMS - DOSED IN MG ELEMENTAL CALCIUM) 500 MG chewable tablet Chew 2 tablets by mouth daily as needed for  indigestion or heartburn.   Past Week at Unknown time  . dimenhyDRINATE (DRAMAMINE) 50 MG tablet Take 50 mg by mouth every 8 (eight) hours as needed for nausea.   Past Week at Unknown time  . IRON PO Take 1 tablet by mouth daily.   Past Week at Unknown time  . Prenatal Vit-Fe Fumarate-FA (PRENATAL MULTIVITAMIN) TABS tablet Take 1 tablet by mouth daily at 12 noon.   06/20/2015 at Unknown time  . pyridOXINE (VITAMIN B-6) 100 MG tablet Take 100 mg by mouth daily as needed (nausea).   Past Week at Unknown time    Review of Systems  Constitutional: Negative for fever, chills and malaise/fatigue.  HENT: Negative for congestion and sore throat.   Eyes: Negative for blurred vision and photophobia.  Respiratory: Negative for cough, hemoptysis and wheezing.   Cardiovascular: Negative for chest pain and palpitations.  Gastrointestinal: Negative for heartburn, nausea, vomiting, diarrhea and constipation.  Genitourinary: Negative for dysuria, urgency and frequency.       "Pressure in the vagina" +dark-red blood tinged mucous with wiping  Denies bright red VB, LOF, contractions, cramping, back pain  Musculoskeletal: Negative for back pain.  Skin: Negative for itching and rash.  Neurological: Negative for dizziness and headaches.  Endo/Heme/Allergies: Does not bruise/bleed easily.  Psychiatric/Behavioral: Negative for  depression. The patient is nervous/anxious.      Physical Exam   Temperature 97.6 F (36.4 C), temperature source Oral, resp. rate 20, last menstrual period 01/25/2015.  Physical Exam  Constitutional: She is oriented to person, place, and time. She appears well-developed and well-nourished.  HENT:  Head: Normocephalic.  Eyes: Pupils are equal, round, and reactive to light.  Neck: Normal range of motion.  Cardiovascular: Normal rate, regular rhythm and normal heart sounds.   Respiratory: Effort normal and breath sounds normal.  GI: Soft. Bowel sounds are normal.  Genitourinary:   Uterus: gravid, non-tender, no contractions palpated  Musculoskeletal: Normal range of motion.  Neurological: She is alert and oriented to person, place, and time. She has normal reflexes.  Skin: Skin is warm and dry.  Psychiatric: She has a normal mood and affect.  SVE: unable to assess cervix d/t bulging membranes / no fetal parts palpated / no VB Sterile Speculum Exam: membranes visualized, no VB, some thick, clear fluid with mild pooling in vaginal vault  Nitrazine test + / Fern Test: negative     Procedures  Dr. Georgianne Fick performed Abdominal Bedside US: +FHR at 126 bmp / fetal movement present / no fetal parts in the vagina / no hourglass membranes visualized / membranes seen at the cervix  Assessment and Plan  IUP at 21+1 weeks Cervical insufficiency with advance dilatation affecting second trimester  Dr. Georgianne Fick discussed options/risks/benefits of inducing labor vs. Expectant management - pt. And husband elect expectant management  Admit for 24 hour Observation  Routine Antenatal orders   Mickeal Skinner 06/22/2015, 4:19 PM   Patient seen and examined by myself.  No contractions FHT 128 on bedside US.  No evidence of ROM positive nitrazine likely secondary to some mild spotting previously noted by patient.  Will monitor overnight given increase in pressure verify to progression.  Discussed prognosis and lack of available fetal interventions at this gestational age.  Malachy Mood, MD

## 2015-06-22 NOTE — Progress Notes (Signed)
Requested by patient to discuss possible options for preterm birth at 41 1/7 weeks. Harrietta Jobst is a 29 yo, Caucasian female, G1P0 at 21+1 weeks by exact LMP of 01/25/2015. Pt. Receives prenatal care at [facility] and this pregnancy was conceived with Letrozol for history of PCOS related infertility and obesity. Pt.was seen at [facility] with mild cramping and spotting on 06/20/15 with findings of advanced cervical dilatation and sent to [facility] for evaluation. Pt. discharged from [facility] on 06/21/15 with a cervical exam of 3-4cm and hourglass membranes per MFM and given preterm labor precautions. MFM declared her cervical exam too advanced for a rescue cerclage. Today, pt. Called on-call midwife with c/o of more pressure and "feeling like something is coming out of her vagina" when she went to the bathroom, and was instructed to come to [facility] for evaluation.At present mother is on bedrest on Labor and Delivery with plans to transfer to floor later on tonight, with discharge home in the morning tomorrow if stable overnight and no progression of dilation.   Explained to both parents that at this gestation (21 1/7 weeks) the baby is still previable due to the gas exchanging structures of the lung not being fully developed. The plan they wish is that per OB, if they can get to 22 5/[redacted] weeks gestation, then antenatal steroids would begin, with hope to continue the pregnancy to 23 weeks. I did explain that the best option would be for delivery in a tertiary center initially, but that we could accept the baby in transfer after the initial hospital course. [facility] is closer to the family than either [facility] or [facility]. Parents are hopeful that they can continue the pregnancy until age of viability.   E. Aadil Sur, NNP-BC

## 2015-06-23 LAB — CULTURE, BETA STREP (GROUP B ONLY)

## 2015-06-23 NOTE — Progress Notes (Signed)
Discharge instructions given to patient and significant other. Patient discharged home at 1300.

## 2015-06-23 NOTE — Discharge Summary (Signed)
Patient ID: Kayla Shelton MRN: [id number] DOB/AGE: 03-30-1986 29 y.o.  Admit date: 06/22/2015 Admission Diagnoses: Cervical insufficiency and advanced dilation in second trimester   Discharge date:  06/23/15 Discharge Diagnoses: 21+[redacted] weeks gestation with cervical insufficiency and advanced dilation in second trimester   Prenatal history: G1P0   EDC : 11/01/2015, by exact LMP of 01/25/2015 Prenatal care at [place] Prenatal course complicated by: PCOS with infertility - conceived with Letrozole, obesity cervical insufficiency and advanced dilation in second trimester, threatened preterm labor   Prenatal Labs: ABO, Rh:   Antibody:   Rubella:    RPR:    HBsAg:    HIV:      Medical / Surgical History :  Past medical history:  Past Medical History  Diagnosis Date  . Obesity affecting pregnancy   . Anxiety   . Infertility, female     Past surgical history:  Past Surgical History  Procedure Laterality Date  . Tonsillectomy    . Hematoma on neck removed    . Adenoidectomy      Family History: History reviewed. No pertinent family history.  Social History:  reports that she has never smoked. She has never used smokeless tobacco. She reports that she does not drink alcohol or use illicit drugs.  Allergies: Sulfa antibiotics   Current Medications at time of admission:  Prior to Admission medications   Medication Sig Start Date End Date Taking? Authorizing Provider  acetaminophen (TYLENOL) 500 MG tablet Take 1,000 mg by mouth every 6 (six) hours as needed for mild pain or headache.    Historical Provider, MD  calcium carbonate (TUMS - DOSED IN MG ELEMENTAL CALCIUM) 500 MG chewable tablet Chew 2 tablets by mouth daily as needed for indigestion or heartburn.    Historical Provider, MD  dimenhyDRINATE (DRAMAMINE) 50 MG tablet Take 50 mg by mouth every 8 (eight) hours as needed for nausea.    Historical Provider, MD  IRON PO Take 1 tablet by mouth daily.     Historical Provider, MD  Prenatal Vit-Fe Fumarate-FA (PRENATAL MULTIVITAMIN) TABS tablet Take 1 tablet by mouth daily at 12 noon.    Historical Provider, MD  pyridOXINE (VITAMIN B-6) 100 MG tablet Take 100 mg by mouth daily as needed (nausea).    Historical Provider, MD      Discharge Instructions: Discharge Instructions    Discharge activity:  No Restrictions    Complete by:  As directed   No prolonged standing - okay to use bathroom, bathe, up in kitchen / do what she feels comfortable doing     Discharge diet:  No restrictions    Complete by:  As directed   Increase fiber in diet to help with constipation     Discharge instructions    Complete by:  As directed   Discussed going directly to [facility] at 22+5 weeks for steroids and inpatient management  If ROM at home, worsening s/s, VB, LOF before 22+5 weeks, come directly to [facility] for care     Do not have sex or do anything that might make you have an orgasm    Complete by:  As directed      Notify physician for a general feeling that "something is not right"    Complete by:  As directed      Notify physician for increase or change in vaginal discharge    Complete by:  As directed      Notify physician for intestinal cramps, with or without diarrhea,  sometimes described as "gas pain"    Complete by:  As directed      Notify physician for leaking of fluid    Complete by:  As directed      Notify physician for low, dull backache, unrelieved by heat or Tylenol    Complete by:  As directed      Notify physician for menstrual like cramps    Complete by:  As directed      Notify physician for pelvic pressure    Complete by:  As directed      Notify physician for uterine contractions.  These may be painless and feel like the uterus is tightening or the baby is  "balling up"    Complete by:  As directed      Notify physician for vaginal bleeding    Complete by:  As directed      PRETERM LABOR:  Includes any of the follwing symptoms that  occur between 20 - [redacted] weeks gestation.  If these symptoms are not stopped, preterm labor can result in preterm delivery, placing your baby at risk    Complete by:  As directed           Discharged Condition: stable  Activity: pelvic rest  Diet: routine increase fiber in diet  Medications: May continue taking OTC Miralax    Medication List    TAKE these medications        acetaminophen 500 MG tablet  Commonly known as:  TYLENOL  Take 1,000 mg by mouth every 6 (six) hours as needed for mild pain or headache.     calcium carbonate 500 MG chewable tablet  Commonly known as:  TUMS - dosed in mg elemental calcium  Chew 2 tablets by mouth daily as needed for indigestion or heartburn.     dimenhyDRINATE 50 MG tablet  Commonly known as:  DRAMAMINE  Take 50 mg by mouth every 8 (eight) hours as needed for nausea.     IRON PO  Take 1 tablet by mouth daily.     prenatal multivitamin Tabs tablet  Take 1 tablet by mouth daily at 12 noon.     pyridOXINE 100 MG tablet  Commonly known as:  VITAMIN B-6  Take 100 mg by mouth daily as needed (nausea).        Discharge Instructions: Preterm/ labor signs reviewed  Discussed going directly to [facility] at 22+5 weeks for steroids and inpatient management  If ROM at home, worsening s/s, VB, LOF before 22+5 weeks, come directly to [facility] for care                                       Discharge to: Home  Dr. Georgianne Fick aware and agrees with plan of care  Signed:  Darliss Cheney, CNM

## 2015-06-23 NOTE — Progress Notes (Addendum)
ANTEPARTUM NOTE - Hospital Day #1  Subjective: Reports feeling "about the same" - still having some pressure when walking to the bathroom, but no worse than yesterday / occasional cramp, but nothing regular or intense - reports it feeling like constipation  Tolerating po intake / no nausea / no vomiting  Bowel habits - reports constipation with no relief with stool softener / Voiding QS Report no vaginal bleeding, but + thick milky mucous discharge + fetal movement      Objective: Vital signs: VS: Blood pressure 103/46, pulse 82, temperature 98.1 F (36.7 C), temperature source Oral, resp. rate 17, height 5\' 1"  (1.549 m), weight 107.502 kg (237 lb), last menstrual period 01/25/2015, SpO2 100 %.  Physical exam:        General appearance/behavior: alert, oriented x 3, anxious       Heart: RRR       Lungs: clear, equal bilaterally       Abdomen: +bowel sounds, non-tender, gravid uterus       Extremities: no edema, no calf pain or tenderness       Bimanual exam / VE: deferred        Fetal Assessment:  FHR by doptone: 144 bmp  Bedside US by Dr. Georgianne Fick this morning - vtx. AFI WNL, no significant hourglass membranes on [place]  Assessment: 21+[redacted] weeks gestation Cervical insufficiency with advance dilatation affecting second trimester  Threatened preterm labor in second trimester  Constipation   Plan:  Discharge home today Preterm labor precautions reviewed  Discussed going directly to [facility] at 22+5 weeks for steroids and inpatient management  If ROM at home, worsening s/s, VB, LOF before 22+5 weeks, come directly to [facility] for care Continue Miralax for constipation discomfort   Dr Georgianne Fick updated with patient status / plan of care  Darliss Cheney, CNM

## 2015-06-25 ENCOUNTER — Encounter
Admission: EM | Disposition: A | Discharge status: Home / Self Care | Payer: Self-pay | Source: Home / Self Care | Attending: Obstetrics and Gynecology

## 2015-06-25 ENCOUNTER — Inpatient Hospital Stay
Admission: EM | Admit: 2015-06-25 | Discharge: 2015-06-27 | DRG: 767 | Disposition: A | Discharge status: Home / Self Care | Payer: BLUE CROSS/BLUE SHIELD | Attending: Obstetrics and Gynecology | Admitting: Obstetrics and Gynecology

## 2015-06-25 ENCOUNTER — Encounter: Payer: Self-pay | Admitting: *Deleted

## 2015-06-25 DIAGNOSIS — Q519 Congenital malformation of uterus and cervix, unspecified: Secondary | ICD-10-CM | POA: Diagnosis not present

## 2015-06-25 DIAGNOSIS — Z6841 Body Mass Index (BMI) 40.0 and over, adult: Secondary | ICD-10-CM | POA: Diagnosis not present

## 2015-06-25 DIAGNOSIS — K5900 Constipation, unspecified: Secondary | ICD-10-CM | POA: Diagnosis present

## 2015-06-25 DIAGNOSIS — O43212 Placenta accreta, second trimester: Secondary | ICD-10-CM | POA: Diagnosis present

## 2015-06-25 DIAGNOSIS — O26872 Cervical shortening, second trimester: Secondary | ICD-10-CM | POA: Diagnosis present

## 2015-06-25 DIAGNOSIS — F419 Anxiety disorder, unspecified: Secondary | ICD-10-CM | POA: Diagnosis present

## 2015-06-25 DIAGNOSIS — Z3A21 21 weeks gestation of pregnancy: Secondary | ICD-10-CM

## 2015-06-25 DIAGNOSIS — O42912 Preterm premature rupture of membranes, unspecified as to length of time between rupture and onset of labor, second trimester: Secondary | ICD-10-CM | POA: Diagnosis present

## 2015-06-25 DIAGNOSIS — O3432 Maternal care for cervical incompetence, second trimester: Secondary | ICD-10-CM | POA: Diagnosis present

## 2015-06-25 DIAGNOSIS — O43219 Placenta accreta, unspecified trimester: Secondary | ICD-10-CM

## 2015-06-25 DIAGNOSIS — O99214 Obesity complicating childbirth: Secondary | ICD-10-CM | POA: Diagnosis present

## 2015-06-25 DIAGNOSIS — E282 Polycystic ovarian syndrome: Secondary | ICD-10-CM | POA: Diagnosis present

## 2015-06-25 DIAGNOSIS — O364XX0 Maternal care for intrauterine death, not applicable or unspecified: Secondary | ICD-10-CM | POA: Diagnosis present

## 2015-06-25 DIAGNOSIS — N979 Female infertility, unspecified: Secondary | ICD-10-CM | POA: Diagnosis present

## 2015-06-25 HISTORY — PX: DILATION AND EVACUATION: SHX1459

## 2015-06-25 LAB — CBC
HCT: 36.4 % (ref 35.0–47.0)
HEMOGLOBIN: 12.3 g/dL (ref 12.0–16.0)
MCH: 27.9 pg (ref 26.0–34.0)
MCHC: 33.7 g/dL (ref 32.0–36.0)
MCV: 82.6 fL (ref 80.0–100.0)
PLATELETS: 433 10*3/uL (ref 150–440)
RBC: 4.4 MIL/uL (ref 3.80–5.20)
RDW: 16 % — ABNORMAL HIGH (ref 11.5–14.5)
WBC: 15.6 10*3/uL — ABNORMAL HIGH (ref 3.6–11.0)

## 2015-06-25 LAB — ABO/RH: ABO/RH(D): A POS

## 2015-06-25 LAB — TYPE AND SCREEN
ABO/RH(D): A POS
Antibody Screen: NEGATIVE

## 2015-06-25 SURGERY — DILATION AND EVACUATION, UTERUS
Anesthesia: General

## 2015-06-25 MED ORDER — CEFOXITIN SODIUM-DEXTROSE 2-2.2 GM-% IV SOLR (PREMIX)
2.0000 g | Freq: Four times a day (QID) | INTRAVENOUS | Status: DC
Start: 2015-06-26 — End: 2015-06-26
  Administered 2015-06-26 (×3): 2000 mg via INTRAVENOUS
  Filled 2015-06-25 (×4): qty 50

## 2015-06-25 MED ORDER — OXYTOCIN BOLUS FROM INFUSION: 500.0000 mL | INTRAVENOUS | Status: DC

## 2015-06-25 MED ORDER — CITRIC ACID-SODIUM CITRATE 334-500 MG/5ML PO SOLN: 30.0000 mL | ORAL | Status: DC | PRN

## 2015-06-25 MED ORDER — ACETAMINOPHEN 325 MG PO TABS
650.0000 mg | ORAL_TABLET | ORAL | Status: DC | PRN
  Administered 2015-06-26 – 2015-06-27 (×2): 650 mg via ORAL
  Filled 2015-06-25 (×2): qty 2

## 2015-06-25 MED ORDER — LACTATED RINGERS IV SOLN
INTRAVENOUS | Status: DC
  Administered 2015-06-25: 19:00:00 via INTRAVENOUS

## 2015-06-25 MED ORDER — BUTORPHANOL TARTRATE 1 MG/ML IJ SOLN
INTRAMUSCULAR | Status: AC
  Administered 2015-06-25: 2 mg via INTRAVENOUS
  Filled 2015-06-25: qty 2

## 2015-06-25 MED ORDER — BUTORPHANOL TARTRATE 1 MG/ML IJ SOLN
2.0000 mg | INTRAMUSCULAR | Status: DC | PRN
  Administered 2015-06-25 (×2): 2 mg via INTRAVENOUS
  Filled 2015-06-25: qty 2

## 2015-06-25 MED ORDER — MISOPROSTOL 200 MCG PO TABS
400.0000 ug | ORAL_TABLET | Freq: Once | ORAL | Status: AC
  Administered 2015-06-25: 400 ug via VAGINAL

## 2015-06-25 MED ORDER — LIDOCAINE HCL (PF) 1 % IJ SOLN: 30.0000 mL | INTRAMUSCULAR | Status: DC | PRN

## 2015-06-25 MED ORDER — LACTATED RINGERS IV SOLN: 500.0000 mL | INTRAVENOUS | Status: DC | PRN

## 2015-06-25 MED ORDER — OXYCODONE-ACETAMINOPHEN 5-325 MG PO TABS
1.0000 | ORAL_TABLET | ORAL | Status: DC | PRN
Start: 2015-06-25 — End: 2015-06-27
  Administered 2015-06-27: 1 via ORAL
  Filled 2015-06-25: qty 1

## 2015-06-25 MED ORDER — MISOPROSTOL 200 MCG PO TABS
ORAL_TABLET | ORAL | Status: AC
  Administered 2015-06-25: 400 ug
  Administered 2015-06-25: 200 ug via VAGINAL
  Filled 2015-06-25: qty 5

## 2015-06-25 MED ORDER — ONDANSETRON HCL 4 MG/2ML IJ SOLN: 4.0000 mg | Freq: Four times a day (QID) | INTRAMUSCULAR | Status: DC | PRN

## 2015-06-25 MED ORDER — OXYTOCIN 40 UNITS IN LACTATED RINGERS INFUSION - SIMPLE MED
62.5000 mL/h | INTRAVENOUS | Status: DC
  Filled 2015-06-25: qty 1000

## 2015-06-25 MED ORDER — OXYCODONE-ACETAMINOPHEN 5-325 MG PO TABS: 2.0000 | ORAL_TABLET | ORAL | Status: DC | PRN

## 2015-06-25 SURGICAL SUPPLY — 23 items
BASIN GRAD PLASTIC 32OZ STRL (MISCELLANEOUS) ×3 IMPLANT
CATH ROBINSON RED A/P 16FR (CATHETERS) IMPLANT
FILTER UTR ASPR SPEC (MISCELLANEOUS) IMPLANT
FLTR UTR ASPR SPEC (MISCELLANEOUS)
GLOVE BIO SURGEON STRL SZ8 (GLOVE) ×3 IMPLANT
GOWN STRL REUS W/ TWL LRG LVL3 (GOWN DISPOSABLE) ×1 IMPLANT
GOWN STRL REUS W/ TWL XL LVL3 (GOWN DISPOSABLE) ×1 IMPLANT
GOWN STRL REUS W/TWL LRG LVL3 (GOWN DISPOSABLE) ×2
GOWN STRL REUS W/TWL XL LVL3 (GOWN DISPOSABLE) ×2
HANDLE YANKAUER SUCT BULB TIP (MISCELLANEOUS) ×3 IMPLANT
KIT RM TURNOVER CYSTO AR (KITS) ×3 IMPLANT
PACK DNC HYST (MISCELLANEOUS) ×3 IMPLANT
PAD OB MATERNITY 4.3X12.25 (PERSONAL CARE ITEMS) ×3 IMPLANT
PAD PREP 24X41 OB/GYN DISP (PERSONAL CARE ITEMS) ×3 IMPLANT
SET BERKELEY SUCTION TUBING (SUCTIONS) ×3 IMPLANT
SPONGE XRAY 4X4 16PLY STRL (MISCELLANEOUS) ×3 IMPLANT
TOWEL OR 17X26 4PK STRL BLUE (TOWEL DISPOSABLE) ×3 IMPLANT
VACURETTE 10 RIGID CVD (CANNULA) IMPLANT
VACURETTE 6 ASPIR F TIP BERK (CANNULA) IMPLANT
VACURETTE 7MM F TIP (CANNULA)
VACURETTE 7MM F TIP STRL (CANNULA) IMPLANT
VACURETTE 8 RIGID CVD (CANNULA) IMPLANT
VACURETTE 8MM F TIP (MISCELLANEOUS) IMPLANT

## 2015-06-25 NOTE — OB Triage Note (Signed)
Pt. Present with spouse, SROM clear, red tinge fluid, lower abd. Pressure.  Fetal heart beat 134 via doppler. Dr. Angelina Sheriff notified.

## 2015-06-25 NOTE — Progress Notes (Signed)
   06/25/15 1828  Clinical Encounter Type  Visited With Patient and family together  Visit Type Initial;Spiritual support  Referral From Nurse  Consult/Referral To Chaplain  Spiritual Encounters  Spiritual Needs Prayer;Emotional  Paged to L&D for Spiritual/Emotional support for pt and family. Burley Saver [phone]

## 2015-06-25 NOTE — Progress Notes (Signed)
Pt states she feels like pushing.   Fetal parts in vagina.  MD called to room.

## 2015-06-25 NOTE — H&P (Signed)
Kayla Shelton is a 29 y.o. female presenting for PPROM  (@1715  tonight )at 21+4 weeks . Pt was evaled by Kayla Shelton 06/20/15, by Kayla Shelton and again by Kayla Shelton 06/22/15. P( see notes ) . Pt was deemed to have an incompetent cervix and advanced dilation and not a candidate for a rescue cerclage . Tonight ROM . Pt has been counseled about the reality that this pregnancy is going to result in PTD and will not survive given her gestation age. This pregnancy was conceived on Letrozole  Exam tonight fetal part into vault. CX 6 -7 cm + membranes surrounding parts . + bloody show .   PMHX : anemia , anxiety , PCOS , depression , HSV-1, obesity  PSHX : tonsillectomy , adenoidectomy ,  FHX: COPD, Dementia GM , DM father , hyperlipidemia in brother , HTN father,  MEdications : PNV , iron , calcium carbonate  Social : No alcohol, no tobacco, no illicit drugs , Allergies : Sulfa   History OB History    Gravida Para Term Preterm AB TAB SAB Ectopic Multiple Living   1 0             Past Medical History  Diagnosis Date  . Obesity affecting pregnancy   . Anxiety   . Infertility, female    Past Surgical History  Procedure Laterality Date  . Tonsillectomy    . Hematoma on neck removed    . Adenoidectomy     Family History: family history is not on file. Social History:  reports that she has never smoked. She has never used smokeless tobacco. She reports that she does not drink alcohol or use illicit drugs.   Prenatal Transfer Tool  Maternal Diabetes: No Genetic Screening: Declined Maternal Ultrasounds/Referrals: Abnormal:  Findings:   Other: incompetent cervix  cervical dilation  Fetal Ultrasounds or other Referrals:  Referred to Materal Fetal Medicine  Maternal Substance Abuse:  No Significant Maternal Medications:  None Significant Maternal Lab Results:  None Other Comments:  incompetent cervix   ROS    Last menstrual period 01/25/2015. Exam Physical Exam  Prenatal labs: ABO, Rh:   A+ Antibody:  negative Rubella:  immune RPR:   NR HBsAg:   neg HIV:   neg  GBS:   too early  Assessment/Plan: PPROM severe prematurity  21+4. Incompetent cervix  Delivery expected soon  Iv stadol prn  cytotec 400 mcg per vagina Offered Chaplain services and explained what they can expect post delivery   Kayla Shelton 06/25/2015, 6:47 PM

## 2015-06-25 NOTE — Progress Notes (Signed)
Placenta still attached.  Cytotec placed by MD in vagina.

## 2015-06-25 NOTE — Anesthesia Preprocedure Evaluation (Signed)
Anesthesia Evaluation  Patient identified by MRN, date of birth, ID band Patient awake    Reviewed: Allergy & Precautions, NPO status , Patient's Chart, lab work & pertinent test results  Airway Mallampati: II  TM Distance: >3 FB Neck ROM: Full    Dental  (+) Chipped   Pulmonary neg pulmonary ROS,    Pulmonary exam normal breath sounds clear to auscultation       Cardiovascular negative cardio ROS Normal cardiovascular exam     Neuro/Psych Anxiety negative neurological ROS     GI/Hepatic negative GI ROS, Neg liver ROS,   Endo/Other  negative endocrine ROS  Renal/GU negative Renal ROS  negative genitourinary   Musculoskeletal negative musculoskeletal ROS (+)   Abdominal Normal abdominal exam  (+)   Peds negative pediatric ROS (+)  Hematology negative hematology ROS (+)   Anesthesia Other Findings   Reproductive/Obstetrics 22 wk delivery...here for retained placenta                             Anesthesia Physical Anesthesia Plan  ASA: II and emergent  Anesthesia Plan: General   Post-op Pain Management:    Induction: Intravenous  Airway Management Planned: Oral ETT  Additional Equipment:   Intra-op Plan:   Post-operative Plan: Extubation in OR  Informed Consent: I have reviewed the patients History and Physical, chart, labs and discussed the procedure including the risks, benefits and alternatives for the proposed anesthesia with the patient or authorized representative who has indicated his/her understanding and acceptance.   Dental advisory given  Plan Discussed with: CRNA and Surgeon  Anesthesia Plan Comments:         Anesthesia Quick Evaluation

## 2015-06-25 NOTE — Progress Notes (Signed)
MD delivered fetus. Cord clamped and fetus taken to be bathed.  Pt still attempting to push placenta.

## 2015-06-25 NOTE — Progress Notes (Signed)
Patient ID: Kayla Shelton, female   DOB: Aug 11, 1986, 29 y.o.   MRN: [id number] Addendum to H+P  Exam  Lungs CTA CV RRR  abd slight TTP  Cervix : 6 cm dilated and fetal parts palpated in vault  + FHT 130's

## 2015-06-25 NOTE — Progress Notes (Signed)
   06/25/15 2130  Clinical Encounter Type  Visited With Family  Visit Type Follow-up;Spiritual support  Referral From Nurse  Consult/Referral To Chaplain  Spiritual Encounters  Spiritual Needs Prayer;Emotional;Grief support  Stress Factors  Family Stress Factors Loss  Follow-up, provided grief/emotional/spiritual support to family. Burley Saver [phone]

## 2015-06-25 NOTE — Progress Notes (Signed)
MD at bedside. Pt attempted to push placenta out.  Cytotec 241mcg inserted.

## 2015-06-26 ENCOUNTER — Inpatient Hospital Stay: Payer: BLUE CROSS/BLUE SHIELD | Admitting: Anesthesiology

## 2015-06-26 ENCOUNTER — Inpatient Hospital Stay: Payer: BLUE CROSS/BLUE SHIELD

## 2015-06-26 ENCOUNTER — Encounter: Payer: Self-pay | Admitting: *Deleted

## 2015-06-26 LAB — CBC WITH DIFFERENTIAL/PLATELET
BASOS ABS: 0 10*3/uL (ref 0–0.1)
Basophils Absolute: 0 10*3/uL (ref 0–0.1)
Basophils Relative: 0 %
EOS ABS: 0 10*3/uL (ref 0–0.7)
EOS ABS: 0.1 10*3/uL (ref 0–0.7)
Eosinophils Relative: 1 %
HCT: 28.6 % — ABNORMAL LOW (ref 35.0–47.0)
HEMATOCRIT: 25.5 % — AB (ref 35.0–47.0)
HEMOGLOBIN: 8.6 g/dL — AB (ref 12.0–16.0)
Hemoglobin: 9.6 g/dL — ABNORMAL LOW (ref 12.0–16.0)
LYMPHS ABS: 3 10*3/uL (ref 1.0–3.6)
Lymphocytes Relative: 8 %
Lymphocytes Relative: 19 %
Lymphs Abs: 1.6 10*3/uL (ref 1.0–3.6)
MCH: 27.6 pg (ref 26.0–34.0)
MCH: 28 pg (ref 26.0–34.0)
MCHC: 33.5 g/dL (ref 32.0–36.0)
MCHC: 33.8 g/dL (ref 32.0–36.0)
MCV: 82.4 fL (ref 80.0–100.0)
MCV: 82.7 fL (ref 80.0–100.0)
MONOS PCT: 6 %
Monocytes Absolute: 0.7 10*3/uL (ref 0.2–0.9)
Monocytes Absolute: 0.9 10*3/uL (ref 0.2–0.9)
Monocytes Relative: 4 %
NEUTROS PCT: 74 %
Neutro Abs: 16.7 10*3/uL — ABNORMAL HIGH (ref 1.4–6.5)
Neutro Abs: 11.7 10*3/uL — ABNORMAL HIGH (ref 1.4–6.5)
PLATELETS: 397 10*3/uL (ref 150–440)
Platelets: 328 10*3/uL (ref 150–440)
RBC: 3.47 MIL/uL — AB (ref 3.80–5.20)
RBC: 3.08 MIL/uL — ABNORMAL LOW (ref 3.80–5.20)
RDW: 16 % — ABNORMAL HIGH (ref 11.5–14.5)
RDW: 15.6 % — ABNORMAL HIGH (ref 11.5–14.5)
WBC: 19 10*3/uL — AB (ref 3.6–11.0)
WBC: 15.8 10*3/uL — ABNORMAL HIGH (ref 3.6–11.0)

## 2015-06-26 LAB — RPR: RPR: NONREACTIVE

## 2015-06-26 LAB — COMPREHENSIVE METABOLIC PANEL
ALBUMIN: 2.4 g/dL — AB (ref 3.5–5.0)
ALT: 11 U/L — AB (ref 14–54)
AST: 20 U/L (ref 15–41)
Alkaline Phosphatase: 84 U/L (ref 38–126)
Anion gap: 7 (ref 5–15)
BUN: 8 mg/dL (ref 6–20)
CHLORIDE: 104 mmol/L (ref 101–111)
CO2: 25 mmol/L (ref 22–32)
CREATININE: 0.85 mg/dL (ref 0.44–1.00)
Calcium: 8.6 mg/dL — ABNORMAL LOW (ref 8.9–10.3)
GFR calc non Af Amer: >60 mL/min (ref >60)
GLUCOSE: 148 mg/dL — AB (ref 65–99)
Potassium: 4.1 mmol/L (ref 3.5–5.1)
SODIUM: 136 mmol/L (ref 135–145)
Total Bilirubin: 0.4 mg/dL (ref 0.3–1.2)
Total Protein: 6 g/dL — ABNORMAL LOW (ref 6.5–8.1)

## 2015-06-26 MED ORDER — OXYTOCIN 10 UNIT/ML IJ SOLN
INTRAMUSCULAR | Status: AC
  Filled 2015-06-26: qty 4

## 2015-06-26 MED ORDER — HYDROCODONE-ACETAMINOPHEN 5-325 MG PO TABS
1.0000 | ORAL_TABLET | ORAL | Status: DC | PRN
  Administered 2015-06-26: 2 via ORAL
  Filled 2015-06-26: qty 2

## 2015-06-26 MED ORDER — OXYTOCIN 40 UNITS IN LACTATED RINGERS INFUSION - SIMPLE MED
125.0000 mL/h | INTRAVENOUS | Status: DC
  Administered 2015-06-26: 125 mL/h via INTRAVENOUS
  Filled 2015-06-26: qty 1000

## 2015-06-26 MED ORDER — HYDROMORPHONE HCL 1 MG/ML IJ SOLN
INTRAMUSCULAR | Status: AC
  Filled 2015-06-26: qty 1

## 2015-06-26 MED ORDER — FENTANYL CITRATE (PF) 100 MCG/2ML IJ SOLN
INTRAMUSCULAR | Status: AC
  Administered 2015-06-26: 04:00:00
  Filled 2015-06-26: qty 2

## 2015-06-26 MED ORDER — ONDANSETRON HCL 4 MG/2ML IJ SOLN: 4.0000 mg | INTRAMUSCULAR | Status: DC | PRN

## 2015-06-26 MED ORDER — DIAZEPAM 5 MG/ML IJ SOLN
10.0000 mg | Freq: Once | INTRAMUSCULAR | Status: AC
  Administered 2015-06-26: 10 mg via INTRAVENOUS

## 2015-06-26 MED ORDER — SODIUM CHLORIDE 0.9 % IJ SOLN
INTRAMUSCULAR | Status: AC
  Filled 2015-06-26: qty 3

## 2015-06-26 MED ORDER — SODIUM CHLORIDE 0.9 % IJ SOLN
INTRAMUSCULAR | Status: AC
  Administered 2015-06-26: 3 mL
  Filled 2015-06-26: qty 9

## 2015-06-26 MED ORDER — GADOBENATE DIMEGLUMINE 529 MG/ML IV SOLN
20.0000 mL | Freq: Once | INTRAVENOUS | Status: AC | PRN
  Administered 2015-06-26: 20 mL via INTRAVENOUS

## 2015-06-26 MED ORDER — HYDROMORPHONE HCL 1 MG/ML IJ SOLN
0.5000 mg | INTRAMUSCULAR | Status: AC | PRN
  Administered 2015-06-26 (×3): 0.5 mg via INTRAVENOUS
  Administered 2015-06-26: 04:00:00 via INTRAVENOUS

## 2015-06-26 MED ORDER — MISOPROSTOL 200 MCG PO TABS
200.0000 ug | ORAL_TABLET | Freq: Four times a day (QID) | ORAL | Status: AC
  Administered 2015-06-26 – 2015-06-27 (×4): 200 ug via ORAL
  Filled 2015-06-26 (×4): qty 1

## 2015-06-26 MED ORDER — HYDROMORPHONE HCL 1 MG/ML IJ SOLN
0.5000 mg | INTRAMUSCULAR | Status: DC | PRN
  Administered 2015-06-26: 04:00:00 via INTRAVENOUS
  Administered 2015-06-26: 0.5 mg via INTRAVENOUS

## 2015-06-26 MED ORDER — CARBOPROST TROMETHAMINE 250 MCG/ML IM SOLN
INTRAMUSCULAR | Status: AC
  Filled 2015-06-26: qty 1

## 2015-06-26 MED ORDER — METHYLERGONOVINE MALEATE 0.2 MG PO TABS
0.2000 mg | ORAL_TABLET | Freq: Three times a day (TID) | ORAL | Status: AC
  Administered 2015-06-26 (×3): 0.2 mg via ORAL
  Filled 2015-06-26 (×8): qty 1

## 2015-06-26 MED ORDER — SILVER NITRATE-POT NITRATE 75-25 % EX MISC
CUTANEOUS | Status: AC
  Filled 2015-06-26: qty 4

## 2015-06-26 MED ORDER — FENTANYL CITRATE (PF) 100 MCG/2ML IJ SOLN
25.0000 ug | INTRAMUSCULAR | Status: AC | PRN
  Administered 2015-06-26 (×4): 25 ug via INTRAVENOUS

## 2015-06-26 MED ORDER — METHYLERGONOVINE MALEATE 0.2 MG/ML IJ SOLN
INTRAMUSCULAR | Status: AC
  Filled 2015-06-26: qty 1

## 2015-06-26 MED ORDER — MORPHINE SULFATE (PF) 2 MG/ML IV SOLN
1.0000 mg | INTRAVENOUS | Status: DC | PRN
  Administered 2015-06-26: 1 mg via INTRAVENOUS
  Filled 2015-06-26: qty 1

## 2015-06-26 MED ORDER — DIAZEPAM 5 MG PO TABS: 10.0000 mg | ORAL_TABLET | Freq: Once | ORAL | Status: DC

## 2015-06-26 MED ORDER — CARBOPROST TROMETHAMINE 250 MCG/ML IM SOLN
INTRAMUSCULAR | Status: AC
  Administered 2015-06-26: 250 ug
  Filled 2015-06-26: qty 1

## 2015-06-26 NOTE — Op Note (Signed)
[name], Kayla Shelton                 ACCOUNT NO.:  [id number]  MEDICAL RECORD NO.:  [id number]  LOCATION:  LDR2                         FACILITY:  [facility]  PHYSICIAN:  Laverta Baltimore, MDDATE OF BIRTH:  1986-03-26  DATE OF PROCEDURE: DATE OF DISCHARGE:                              OPERATIVE REPORT   PREOPERATIVE DIAGNOSIS:  Retained placenta, status post spontaneous vaginal delivery, 21+ [redacted] weeks gestation.  POSTOPERATIVE DIAGNOSIS:  Retained placenta, status post spontaneous vaginal delivery, 21+ [redacted] weeks gestation.  ANESTHESIA:  General endotracheal anesthesia.  SURGEON:  Laverta Baltimore, MD.  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  The patient was unable to deliver the placenta and fragmented parts of the placenta were removed in the Labor and Delivery suite which prompted the patient to be taken to the operating room.  DESCRIPTION OF PROCEDURE:  After adequate general endotracheal anesthesia, the patient was placed in dorsal supine position.  Legs were placed in the candy-cane stirrups.  The patient received 2 g of IV cefoxitin prior to commencement of the case.  A weighted speculum was placed in the posterior vaginal vault.  Large clot was removed from the vagina, followed by removal of placental fragments within the uterus. The placental fragments were extremely adherent to the uterus.  A banjo curette was used with several attempts to remove total fragments.  It was felt that there were still some retained fragments, but the procedure was terminated due to the amount of bleeding that she had. 650 mL of blood loss was anticipated.  At this point, the surgeon thought it was prudent to abort any additional curettage attempts given the amount of blood loss.  250 mcg of Hemabate intramuscularly was administered as well as 0.2 mg  of Methergine was given IM as well.  Good hemostasis was noted at the end of the case.  The patient's bladder was catheterized prior to commencement yielded 400 mL.  The patient was taken yo recovery room in good condition.  COMPLICATIONS:  None.  INTRAOPERATIVE FLUIDS:  800 mL.  40 units of Pitocin in 1000 mL of lactated Ringer's was running at the end of the case.  ESTIMATED BLOOD LOSS:  650 mL.  URINE OUTPUT:  400 mL.          ______________________________ Laverta Baltimore, MD     TS/MEDQ  D:  06/26/2015  T:  06/26/2015  Job:  [id number]

## 2015-06-26 NOTE — Progress Notes (Signed)
To OR in stable condition with IV infusing.

## 2015-06-26 NOTE — Progress Notes (Signed)
Pt assisted to bathroom, she voided 21ml and became lightheaded.  Clean pads on and assisted back to bed.

## 2015-06-26 NOTE — Plan of Care (Signed)
Orderly here to pick patient up for transport to MRI. Scotty from MRI called to inform RN to hold patient in unit until further notice, waiting on Radiologist.

## 2015-06-26 NOTE — Progress Notes (Signed)
Pt. Return from MRI with spouse at her bedside. Pt. Alert and oriented, IV infusing, LR with 40 units of Oxytocin. VSS.

## 2015-06-26 NOTE — Progress Notes (Signed)
RN assist pt. To BR for void and pericare, (ambulation-steady gait), Pericare instructions given, demo given, pt. Return demo. Pt. c/o slight headache, Tylenol given earlier. Pt. refuse anything stronger at this time, "maybe later".  Family members at the bedside providing support.

## 2015-06-26 NOTE — Progress Notes (Addendum)
Patient ID: Kayla Shelton, female   DOB: [birth year], 29 y.o.   MRN: [id number]  Progress note update:  Pt feeling crampy and well overall. Discussion of conservative management included concerns for hemorrhage and sepsis if retained products managed conservatively. She is aware that in this case, hysterectomy may be required. She is aware of s/s of infection, including fever, increased pain and increased bleeding.  MRI with and without contrast today. I spoke with pelvic radiologist Dr. Earle Gell, who is different from the dictated reading radiologist. He stated that there is a rind of shaggy tissue along the anterior and posterior uterine fundus that is consistent with mild placenta accreta, and less likely may be inflammatory reaction to recent vigorous D&C. He did state no evidence of deep invasion, but that the area does enhance on MRI. 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   Discussion with MFM Dr. Manfred Shirts at [facility]: Suggests coming to [facility] clinic tomorrow for discussion with Sylvan Cheese of conservative management of accreta with their unpublished protocol. She also suggests consult with interventional radiology, so that that resource would be available promptly in case of bleeding prior to hysterectomy. This order has been placed.   Pt is currently stable. Her bleeding is normal postpartum. Her uterine fundus is firm.  An ultrasound is pending to evaluate uterine contents for consideration of serial ultrasounds if conservative management. A beta hcg is pending.  Filed Vitals:   06/26/15 1614  BP: 101/78  Pulse: 95  Temp: 98 F (36.7 C)  Resp: 16    Discussion of above with FOB and patient's mom, as patient is in ultrasound.

## 2015-06-26 NOTE — Progress Notes (Signed)
Pt assisted to bathroom.  Steady on her feet.  Shown pericare, did not want the mesh panties on.  Back to bed without difficulty

## 2015-06-26 NOTE — Progress Notes (Signed)
Post Partum Day 1 s/p 21 week vaginal delivery and retained placenta s/p evacuation in OR .possible continued retained tissue given the fragmented tissue removed in OR  Subjective: tired , pain ok   Objective: Blood pressure 120/74, pulse 99, temperature 98 F (36.7 C), temperature source Oral, resp. rate 16, last menstrual period 01/25/2015, SpO2 99 %.  Physical Exam:  General: alert and cooperative Lochia: appropriate Uterine Fundus: firm Incision:  Lungs CTA  CV RRR  DVT Evaluation: No evidence of DVT seen on physical exam.   Recent Labs  06/25/15 1842 06/26/15 0645  HGB 12.3 9.6*  HCT 36.4 28.6*    Assessment/Plan: S/p 21 week SVD complicated by retained placenta . Given my intraop eval there may still be retained tissue c/w either a placenta accreta or a uterine malformation ( Twin sister states that she has a uterine abnormality ). Clinically stable based on UO and HCT .  Plan on MRI of pelvis today to assess the uterus Valium before  Detailed conversation with pt and her husband today explaining our plan . Based on the findings of the MR we did speak to her about additional intervention  Repeat CBC at 1500     LOS: 1 day   SCHERMERHORN,THOMAS 06/26/2015, 9:21 AM

## 2015-06-26 NOTE — Transfer of Care (Addendum)
Immediate Anesthesia Transfer of Care Note  Patient: Kayla Shelton  Procedure(s) Performed: Procedure(s): DILATATION AND EVACUATION (N/A)  Patient Location: PACU  Anesthesia Type:General  Level of Consciousness: awake  Airway & Oxygen Therapy: Patient Spontanous Breathing  Post-op Assessment: Report given to RN  Post vital signs: stable  Last Vitals:  Filed Vitals:   06/26/15 1614  BP: 101/78  Pulse: 95  Temp: 36.7 C  Resp: 16    Complications: No apparent anesthesia complications

## 2015-06-27 ENCOUNTER — Encounter: Payer: Self-pay | Admitting: Obstetrics and Gynecology

## 2015-06-27 ENCOUNTER — Inpatient Hospital Stay
Admit: 2015-06-27 | Discharge: 2015-06-27 | Disposition: A | Discharge status: Home / Self Care | Payer: BLUE CROSS/BLUE SHIELD | Attending: Obstetrics and Gynecology | Admitting: Obstetrics and Gynecology

## 2015-06-27 ENCOUNTER — Other Ambulatory Visit: Payer: Self-pay

## 2015-06-27 DIAGNOSIS — O031 Delayed or excessive hemorrhage following incomplete spontaneous abortion: Secondary | ICD-10-CM

## 2015-06-27 LAB — BASIC METABOLIC PANEL
Anion gap: 5 (ref 5–15)
BUN: 8 mg/dL (ref 6–20)
CALCIUM: 8.5 mg/dL — AB (ref 8.9–10.3)
CHLORIDE: 105 mmol/L (ref 101–111)
CO2: 25 mmol/L (ref 22–32)
CREATININE: 0.55 mg/dL (ref 0.44–1.00)
GFR calc non Af Amer: >60 mL/min (ref >60)
Glucose, Bld: 146 mg/dL — ABNORMAL HIGH (ref 65–99)
Potassium: 3.3 mmol/L — ABNORMAL LOW (ref 3.5–5.1)
SODIUM: 135 mmol/L (ref 135–145)

## 2015-06-27 LAB — CBC WITH DIFFERENTIAL/PLATELET
Basophils Absolute: 0 10*3/uL (ref 0–0.1)
Basophils Relative: 0 %
EOS ABS: 0.2 10*3/uL (ref 0–0.7)
EOS PCT: 2 %
HCT: 24.8 % — ABNORMAL LOW (ref 35.0–47.0)
Hemoglobin: 8.3 g/dL — ABNORMAL LOW (ref 12.0–16.0)
LYMPHS ABS: 2.4 10*3/uL (ref 1.0–3.6)
LYMPHS PCT: 23 %
MCH: 27.9 pg (ref 26.0–34.0)
MCHC: 33.6 g/dL (ref 32.0–36.0)
MCV: 82.9 fL (ref 80.0–100.0)
MONO ABS: 0.4 10*3/uL (ref 0.2–0.9)
MONOS PCT: 4 %
Neutro Abs: 7.3 10*3/uL — ABNORMAL HIGH (ref 1.4–6.5)
Neutrophils Relative %: 71 %
PLATELETS: 327 10*3/uL (ref 150–440)
RBC: 2.99 MIL/uL — ABNORMAL LOW (ref 3.80–5.20)
RDW: 15.8 % — AB (ref 11.5–14.5)
WBC: 10.4 10*3/uL (ref 3.6–11.0)

## 2015-06-27 LAB — HCG, QUANTITATIVE, PREGNANCY: hCG, Beta Chain, Quant, S: 3882 m[IU]/mL — ABNORMAL HIGH (ref <5)

## 2015-06-27 LAB — MISC LABCORP TEST (SEND OUT): LABCORP TEST CODE: 86884

## 2015-06-27 LAB — SURGICAL PATHOLOGY

## 2015-06-27 MED ORDER — SODIUM CHLORIDE 0.9 % IJ SOLN
INTRAMUSCULAR | Status: AC
  Administered 2015-06-27: 3 mL
  Filled 2015-06-27: qty 3

## 2015-06-27 MED ORDER — NAPROXEN 500 MG PO TABS: 500.0000 mg | ORAL_TABLET | Freq: Two times a day (BID) | ORAL | Status: DC

## 2015-06-27 MED ORDER — ALPRAZOLAM 0.25 MG PO TABS
0.5000 mg | ORAL_TABLET | Freq: Two times a day (BID) | ORAL | Status: DC | PRN
  Administered 2015-06-27: 0.5 mg via ORAL
  Filled 2015-06-27: qty 2

## 2015-06-27 MED ORDER — ALPRAZOLAM 0.5 MG PO TABS: 0.5000 mg | ORAL_TABLET | Freq: Two times a day (BID) | ORAL | Status: DC | PRN

## 2015-06-27 NOTE — Discharge Summary (Signed)
Obstetric Discharge Summary Reason for Admission: PPROM 21 + wweks with incompetent cx  Prenatal Procedures: none Intrapartum Procedures: spontaneous vaginal delivery, fetal death severe prematurity   Postpartum Procedures: reatined placental s/p Banjo curettage, U/S , PELVIC MRI Complications-Operative and Postpartum: retained placenta ? accreta, acurate uterus  HEMOGLOBIN  Date Value Ref Range Status  07-16-2015 8.3* 12.0 - 16.0 g/dL Final   HCT  Date Value Ref Range Status  07-16-15 24.8* 35.0 - 47.0 % Final    Physical Exam:  General: alert and cooperative Lochia: appropriate Uterine Fundus: firm Incision: n/a DVT Evaluation: No evidence of DVT seen on physical exam.  Discharge Diagnoses: preterm delivery and fetal death , retained placenta. MFM eval and transfer to [facility] for repeat suction D+C  Discharge Information: Date: 07-16-2015 Activity: pelvic rest Diet: routine Medications: naprosyn , xanax  Condition: stable Instructions: refer to practice specific booklet Discharge to: home and to [facility] for additional Suction D+C Follow-up Information    Follow up with Abbott Jasinski, MD In 2 weeks.   Specialty:  Obstetrics and Gynecology   Contact information:   [facility] [id number] [phone]       Newborn Data: Live born female  Birth Weight: 12.5 oz (354 g) APGAR: 0, 0  Home with fetal death .  Kayla Shelton July 16, 2015, 1:57 PM

## 2015-06-27 NOTE — Progress Notes (Signed)
Pt ambulated to hall way in stable condition.  She asked for ice pack and tylenol for neck pain otherwise she has no other complaints.

## 2015-06-27 NOTE — Progress Notes (Signed)
29 yo now Para 0100 MWF brought to [facility] for scan after she experienced ROM and passage of fetus on Monday night 06/24/15 . Pt had been seen at [facility] on 10/6 due to advanced cervial dilation for consideration of a rescue cerclage. After delivery at [facility] on Monday, placenta did not pass spontaneously and Dr Ouida Sills attempted removal with rings and banjo curette without success. He was concerned that there might be a placenta accreta.  Pt had further imaging yesterday that suggested blood clot versus retained placenta versus uterine abnormality. Pt has had no heavy bleeding and no fever. TA imaging today shows a 13 x 7 cm uterus ,  On EV imaging there is a 6 x4 x4 cm circumscribed echogenic area in the LUS c/w placenta - there is a well demarcated transition to myometrium surrounding this area. I believe the findings are most c/w retained placenta .  I recommended suction D&C under u/s guidance to remove likely retained placenta .  Dr Ouida Sills  requests pt be accepted in transfer to [facility] to allow all possible efforts to allow pt to retain fertility Her last HGb was 8.3/24% and pt says she will accept blood products if necessary ( no ongoing bleeding)  I have images transferred to CD for pt to carry to [facility]. Given no ongoing bleeding or fever , I agree with allowing pt's family to carry her up to [facility] to report to [facility] Triage . She will be discharged form [place]  I have told pt to remain NPO.  I contacted Dr Tarri Fuller on [facility] L&D   Lillia Abed

## 2015-06-27 NOTE — Progress Notes (Signed)
Dr. Ouida Sills at bedside to discuss POC with patient at this time.

## 2015-06-27 NOTE — Progress Notes (Signed)
Pt was reassessed at 2315 because she wanted to go asleep.   Room darkened, her fundus was firm no additional complaints noted.

## 2015-06-27 NOTE — Anesthesia Postprocedure Evaluation (Signed)
  Anesthesia Post-op Note  Patient: Kayla Shelton  Procedure(s) Performed: Procedure(s): DILATATION AND EVACUATION (N/A)  Anesthesia type:General  Patient location: PACU  Post pain: Pain level controlled  Post assessment: Post-op Vital signs reviewed, Patient's Cardiovascular Status Stable, Respiratory Function Stable, Patent Airway and No signs of Nausea or vomiting  Post vital signs: Reviewed and stable  Last Vitals:  Filed Vitals:   06/27/15 1057  BP: 92/58  Pulse: 93  Temp: 36.7 C  Resp: 24    Level of consciousness: awake, alert  and patient cooperative  Complications: No apparent anesthesia complications

## 2015-06-27 NOTE — Progress Notes (Signed)
Post Partum Day 2 from 21 week PTD complicated by retained placenta and s/p D+C  With possible accreta and uterine anomaly . See Dr Migdalia Dk note from last pm . Pt feels ok today . Still emotional . Denies N/V/ fever or chills . Subjective:   Objective: Blood pressure 107/71, pulse 98, temperature 97.6 F (36.4 C), temperature source Oral, resp. rate 16, height 5\' 1"  (1.549 m), weight 106.142 kg (234 lb), last menstrual period 01/25/2015, SpO2 97 %.  Physical Exam:  General: alert and cooperative Lochia: appropriate Uterine Fundus: firm Incision: n/a DVT Evaluation: No evidence of DVT seen on physical exam. Lungs CTA  CV RRR  Recent Labs  06/26/15 0645 06/26/15 1653  HGB 9.6* 8.6*  HCT 28.6* 25.5*    Assessment/Plan: Retained placenta with possibility of accreta . Pt is clinically stable.  Spoke with Dr Lehman Prom , MFM , who will evaluate today.  CBC  Xanax 0.5 mg po bid prn     LOS: 2 days   Kaled Allende 06/27/2015, 8:55 AM

## 2015-07-10 ENCOUNTER — Inpatient Hospital Stay: Payer: BLUE CROSS/BLUE SHIELD

## 2015-07-10 ENCOUNTER — Encounter: Payer: Self-pay | Admitting: Internal Medicine

## 2015-07-10 ENCOUNTER — Inpatient Hospital Stay: Payer: BLUE CROSS/BLUE SHIELD | Attending: Internal Medicine | Admitting: Internal Medicine

## 2015-07-10 ENCOUNTER — Encounter (INDEPENDENT_AMBULATORY_CARE_PROVIDER_SITE_OTHER): Payer: Self-pay

## 2015-07-10 VITALS — BP 118/68 | HR 54 | Temp 98.0°F | Wt 233.0 lb

## 2015-07-10 DIAGNOSIS — D500 Iron deficiency anemia secondary to blood loss (chronic): Secondary | ICD-10-CM

## 2015-07-10 DIAGNOSIS — Z87442 Personal history of urinary calculi: Secondary | ICD-10-CM

## 2015-07-10 DIAGNOSIS — Z79899 Other long term (current) drug therapy: Secondary | ICD-10-CM

## 2015-07-10 DIAGNOSIS — F419 Anxiety disorder, unspecified: Secondary | ICD-10-CM | POA: Diagnosis not present

## 2015-07-10 DIAGNOSIS — R5383 Other fatigue: Secondary | ICD-10-CM

## 2015-07-10 DIAGNOSIS — D508 Other iron deficiency anemias: Secondary | ICD-10-CM

## 2015-07-10 DIAGNOSIS — K5900 Constipation, unspecified: Secondary | ICD-10-CM

## 2015-07-10 DIAGNOSIS — K649 Unspecified hemorrhoids: Secondary | ICD-10-CM

## 2015-07-10 DIAGNOSIS — Z3A20 20 weeks gestation of pregnancy: Secondary | ICD-10-CM | POA: Insufficient documentation

## 2015-07-10 DIAGNOSIS — E669 Obesity, unspecified: Secondary | ICD-10-CM

## 2015-07-10 DIAGNOSIS — D473 Essential (hemorrhagic) thrombocythemia: Secondary | ICD-10-CM | POA: Diagnosis not present

## 2015-07-10 DIAGNOSIS — K219 Gastro-esophageal reflux disease without esophagitis: Secondary | ICD-10-CM | POA: Diagnosis not present

## 2015-07-10 DIAGNOSIS — R188 Other ascites: Secondary | ICD-10-CM

## 2015-07-10 LAB — CBC WITH DIFFERENTIAL/PLATELET
Basophils Absolute: 0.1 10*3/uL (ref 0–0.1)
Basophils Relative: 1 %
Eosinophils Absolute: 0.3 10*3/uL (ref 0–0.7)
Eosinophils Relative: 4 %
HEMATOCRIT: 27.9 % — AB (ref 35.0–47.0)
HEMOGLOBIN: 9.3 g/dL — AB (ref 12.0–16.0)
LYMPHS ABS: 3.1 10*3/uL (ref 1.0–3.6)
LYMPHS PCT: 34 %
MCH: 26.8 pg (ref 26.0–34.0)
MCHC: 33.3 g/dL (ref 32.0–36.0)
MCV: 80.5 fL (ref 80.0–100.0)
Monocytes Absolute: 0.6 10*3/uL (ref 0.2–0.9)
Monocytes Relative: 7 %
NEUTROS PCT: 54 %
Neutro Abs: 5 10*3/uL (ref 1.4–6.5)
Platelets: 672 10*3/uL — ABNORMAL HIGH (ref 150–440)
RBC: 3.47 MIL/uL — AB (ref 3.80–5.20)
RDW: 15.1 % — ABNORMAL HIGH (ref 11.5–14.5)
WBC: 9.1 10*3/uL (ref 3.6–11.0)

## 2015-07-10 LAB — IRON AND TIBC
IRON: 18 ug/dL — AB (ref 28–170)
Saturation Ratios: 4 % — ABNORMAL LOW (ref 10.4–31.8)
TIBC: 438 ug/dL (ref 250–450)
UIBC: 420 ug/dL

## 2015-07-10 LAB — LACTATE DEHYDROGENASE: LDH: 170 U/L (ref 98–192)

## 2015-07-10 LAB — FERRITIN: Ferritin: 5 ng/mL — ABNORMAL LOW (ref 11–307)

## 2015-07-10 NOTE — Progress Notes (Signed)
[place] NOTE  Patient Care Team: Marinda Elk, MD as PCP - General (Physician Assistant)  CHIEF COMPLAINTS/PURPOSE OF CONSULTATION: Anemia.  # ANEMIA sec to Blood loss/IDA-  Recent miscarriage/abortion;  Poor tolerance of by mouth iron; NOV 2016- START IV Ferrahem x2  HISTORY OF PRESENTING ILLNESS:  Kayla Shelton 29 y.o.  female referred to [place] for further evaluation and management of anemia.  Patient stated that she always had " anemia";  However more recently after her miscarriage/ stillbirth  End of September 2016;  She is noted to have a hemoglobin of 8.5.  Patient had been on by mouth iron but poor tolerance second of constipation.   Patient  Has chronic fatigue;  This is worse with her anemia.  She denies any chronic long-standing blood in stools or black stools.  She has intermittent hemorrhoidal bleeding with constipation.  No family history of colon cancers.  ROS: A complete 10 point review of system is done which is negative except mentioned above in history of present illness  MEDICAL HISTORY:  Past Medical History  Diagnosis Date  . Obesity affecting pregnancy   . Anxiety   . Infertility, female   . Anemia   . GERD (gastroesophageal reflux disease)   . Constipation   . Depression   . Kidney stones   . Bladder infection     SURGICAL HISTORY: Past Surgical History  Procedure Laterality Date  . Tonsillectomy    . Hematoma on neck removed    . Adenoidectomy    . Dilation and evacuation N/A 06/25/2015    Procedure: DILATATION AND EVACUATION;  Surgeon: Boykin Nearing, MD;  Location: [facility] ORS;  Service: Gynecology;  Laterality: N/A;    SOCIAL HISTORY: Social History   Social History  . Marital Status: Single    Spouse Name: N/A  . Number of Children: N/A  . Years of Education: N/A   Occupational History  . Not on file.   Social History Main Topics  . Smoking status: Never Smoker   . Smokeless tobacco: Never Used  .  Alcohol Use: Yes     Comment: social alcohol use  . Drug Use: No  . Sexual Activity: Yes   Other Topics Concern  . Not on file   Social History Narrative    FAMILY HISTORY: History reviewed. No pertinent family history.  ALLERGIES:  is allergic to sulfa antibiotics.  MEDICATIONS:  Current Outpatient Prescriptions  Medication Sig Dispense Refill  . acetaminophen (TYLENOL) 500 MG tablet Take 1,000 mg by mouth every 6 (six) hours as needed for mild pain or headache.    . ALPRAZolam (XANAX) 0.5 MG tablet Take 1 tablet (0.5 mg total) by mouth 2 (two) times daily as needed for anxiety. 30 tablet 0  . naproxen (NAPROSYN) 500 MG tablet Take 1 tablet (500 mg total) by mouth 2 (two) times daily with a meal. 60 tablet 0  . oxyCODONE (OXY IR/ROXICODONE) 5 MG immediate release tablet   0   No current facility-administered medications for this visit.      Marland Kitchen  PHYSICAL EXAMINATION: ECOG PERFORMANCE STATUS: 1 - Symptomatic but completely ambulatory  Filed Vitals:   07/10/15 1427  BP: 118/68  Pulse: 54  Temp: 98 F (36.7 C)   Filed Weights   07/10/15 1427  Weight: 233 lb 0.4 oz (105.7 kg)    GENERAL: Well-nourished well-developed; Alert, no distress and comfortable.   Obese. EYES: appears pale OROPHARYNX: no thrush or ulceration; good  dentition  NECK: supple, no masses felt LYMPH:  no palpable lymphadenopathy in the cervical, axillary or inguinal regions LUNGS: clear to auscultation and  No wheeze or crackles HEART/CVS: regular rate & rhythm and no murmurs; No lower extremity edema ABDOMEN: abdomen soft, non-tender and normal bowel sounds Musculoskeletal:no cyanosis of digits and no clubbing  PSYCH: alert & oriented x 3 with fluent speech NEURO: no focal motor/sensory deficits SKIN:  no rashes or significant lesions  LABORATORY DATA:  I have reviewed the data as listed Lab Results  Component Value Date   WBC 10.4 06/27/2015   HGB 8.3* 06/27/2015   HCT 24.8* 06/27/2015    MCV 82.9 06/27/2015   PLT 327 06/27/2015    Recent Labs  06/26/15 0645 06/27/15 0908  NA 136 135  K 4.1 3.3*  CL 104 105  CO2 25 25  GLUCOSE 148* 146*  BUN 8 8  CREATININE 0.85 0.55  CALCIUM 8.6* 8.5*  GFRNONAA >60 >60  GFRAA >60 >60  PROT 6.0*  --   ALBUMIN 2.4*  --   AST 20  --   ALT 11*  --   ALKPHOS 84  --   BILITOT 0.4  --     RADIOGRAPHIC STUDIES: I have personally reviewed the radiological images as listed and agreed with the findings in the report. Mr Pelvis W Wo Contrast  06/26/2015  CLINICAL DATA:  Status post 21 week spontaneous vaginal delivery with retained placenta. Evaluate for placenta accreta or uterine malformation. EXAM: MRI PELVIS WITHOUT AND WITH CONTRAST TECHNIQUE: Multiplanar multisequence MR imaging of the pelvis was performed both before and after administration of intravenous contrast. CONTRAST:  54mL MULTIHANCE GADOBENATE DIMEGLUMINE 529 MG/ML IV SOLN COMPARISON:  None. FINDINGS: Limited evaluation due to motion degradation (related to uterine contractions) and recent postpartum state. Recent postpartum uterus is notable for expansion of the uterine cavity with blood products. Following contrast administration, there is enhancing retained placental tissue/products of conception, predominantly along the anterior uterine fundus (series 16/image 20). On sagittal T2 imaging, there is relative preservation of the low-density myometrium beneath the placenta (series 5/image 14), without evidence of placenta percreta or increta. Placenta accreta is difficult to exclude by MRI but no definitive findings are present. Mildly divergent uterine horns with a normal outer fundal contour, suggesting an arcuate configuration (series 2/image 15). Overlying 1.4 x 2.1 cm subserosal uterine fundal fibroid (series 2/image 14). Small volume pelvic ascites. No suspicious pelvic lymphadenopathy. Left ovary is within normal limits (series 3/image 8). Right ovary is not discretely  visualized. Bladder is within normal limits. No focal osseous lesions. IMPRESSION: Low limited evaluation due to motion degradation (uterine contractions) and recent postpartum state. Retained products of conception with residual placenta, predominantly along the anterior uterine fundus, as described above. No evidence of placenta percreta or increta. Placenta accreta is difficult to exclude by MRI but no definitive findings are present. Associated blood products in the uterine cavity. If bleeding persists and there is continued clinical concern, consider follow-up pelvic ultrasound or repeat pelvic MRI in 2 weeks. Suspected arcuate uterine configuration. 2.1 cm subserosal uterine fundal fibroid. Electronically Signed   By: Julian Hy M.D.   On: 06/26/2015 13:04   US Transvaginal Non-ob  06/26/2015  CLINICAL DATA:  Persistent vaginal bleeding 1 day postpartum. Evaluate for retained products conception. EXAM: TRANSABDOMINAL AND TRANSVAGINAL ULTRASOUND OF PELVIS TECHNIQUE: Both transabdominal and transvaginal ultrasound examinations of the pelvis were performed. Transabdominal technique was performed for global imaging of the pelvis including uterus,  ovaries, adnexal regions, and pelvic cul-de-sac. It was necessary to proceed with endovaginal exam following the transabdominal exam to visualize the endometrium and myometrium. COMPARISON:  MRI on 06/26/2015 FINDINGS: Uterus Enlarged postpartum uterus seen. A heterogeneous masslike area containing some echogenic foci is seen in the cervix. This measures approximately 4.5 x 5.5 x 4.7 cm. This shows no internal blood flow (or contrast enhancement on recent MRI) and is most consistent with blood clot. Endometrium Thickness: Difficult to accurately measure due to indistinct endometrial- myometrial junction. Measures approximately 30 mm. Right ovary Measurements: Not visualized, however no adnexal mass identified. Left ovary Measurements: Not visualized, however no  adnexal mass identified. Other findings No free fluid. IMPRESSION: Enlarged postpartum uterus. Large amount of avascular blood clot visualized in endocervical canal. Endometrial thickness measures approximately 30 mm, although difficult to visualize due to indistinct endometrial-myometrial junction. Retained products conception cannot be excluded. Electronically Signed   By: Earle Gell M.D.   On: 06/26/2015 20:09   US Pelvis Complete  06/26/2015  CLINICAL DATA:  Persistent vaginal bleeding 1 day postpartum. Evaluate for retained products conception. EXAM: TRANSABDOMINAL AND TRANSVAGINAL ULTRASOUND OF PELVIS TECHNIQUE: Both transabdominal and transvaginal ultrasound examinations of the pelvis were performed. Transabdominal technique was performed for global imaging of the pelvis including uterus, ovaries, adnexal regions, and pelvic cul-de-sac. It was necessary to proceed with endovaginal exam following the transabdominal exam to visualize the endometrium and myometrium. COMPARISON:  MRI on 06/26/2015 FINDINGS: Uterus Enlarged postpartum uterus seen. A heterogeneous masslike area containing some echogenic foci is seen in the cervix. This measures approximately 4.5 x 5.5 x 4.7 cm. This shows no internal blood flow (or contrast enhancement on recent MRI) and is most consistent with blood clot. Endometrium Thickness: Difficult to accurately measure due to indistinct endometrial- myometrial junction. Measures approximately 30 mm. Right ovary Measurements: Not visualized, however no adnexal mass identified. Left ovary Measurements: Not visualized, however no adnexal mass identified. Other findings No free fluid. IMPRESSION: Enlarged postpartum uterus. Large amount of avascular blood clot visualized in endocervical canal. Endometrial thickness measures approximately 30 mm, although difficult to visualize due to indistinct endometrial-myometrial junction. Retained products conception cannot be excluded. Electronically  Signed   By: Earle Gell M.D.   On: 06/26/2015 20:09    ASSESSMENT & PLAN:   #  Iron deficiency anemia-  From the recent miscarriage/ blood loss;  Poor tolerance to by mouth iron.  I recommend checking CBC; iron studies ferritin LDH.  Recommend IV iron infusion/ Feraheme every weekly times 2.   #  Thrombocytosis-  Secondary/ reactive.  Monitor closely  Status post IV  iron infusion. If  thrombocytosis does not get any better than workup for ET.   #  Recommend checking CBC in approximately one month.  #  Follow-up with me with CBC/ ferritin in 2 months  All questions were answered. The patient knows to call the clinic with any problems, questions or concerns.  Thank you Dr. Leafy Ro for allowing me to participate in the care of your pleasant patient. Please do not hesitate to contact me with questions or concerns in the interim.  I spent 15 minutes counseling the patient face to face. The total time spent in the appointment was 30 minutes and more than 50% was on counseling.     Cammie Sickle, MD 07/10/2015 2:46 PM

## 2015-07-12 ENCOUNTER — Inpatient Hospital Stay: Payer: BLUE CROSS/BLUE SHIELD

## 2015-07-12 ENCOUNTER — Ambulatory Visit: Payer: BLUE CROSS/BLUE SHIELD | Admitting: Internal Medicine

## 2015-07-12 VITALS — BP 120/64 | HR 60 | Temp 97.4°F | Resp 20

## 2015-07-12 DIAGNOSIS — D500 Iron deficiency anemia secondary to blood loss (chronic): Secondary | ICD-10-CM | POA: Diagnosis not present

## 2015-07-12 DIAGNOSIS — D508 Other iron deficiency anemias: Secondary | ICD-10-CM

## 2015-07-12 MED ORDER — SODIUM CHLORIDE 0.9 % IV SOLN
INTRAVENOUS | Status: DC
  Administered 2015-07-12: 14:00:00 via INTRAVENOUS
  Filled 2015-07-12: qty 1000

## 2015-07-12 MED ORDER — FERUMOXYTOL INJECTION 510 MG/17 ML
510.0000 mg | Freq: Once | INTRAVENOUS | Status: AC
  Administered 2015-07-12: 510 mg via INTRAVENOUS
  Filled 2015-07-12: qty 17

## 2015-07-18 ENCOUNTER — Inpatient Hospital Stay: Payer: BLUE CROSS/BLUE SHIELD | Attending: Internal Medicine

## 2015-07-18 VITALS — BP 103/73 | HR 101 | Temp 97.3°F | Resp 18

## 2015-07-18 DIAGNOSIS — D509 Iron deficiency anemia, unspecified: Secondary | ICD-10-CM | POA: Diagnosis not present

## 2015-07-18 DIAGNOSIS — Z79899 Other long term (current) drug therapy: Secondary | ICD-10-CM | POA: Insufficient documentation

## 2015-07-18 DIAGNOSIS — D508 Other iron deficiency anemias: Secondary | ICD-10-CM

## 2015-07-18 MED ORDER — SODIUM CHLORIDE 0.9 % IV SOLN
510.0000 mg | Freq: Once | INTRAVENOUS | Status: AC
  Administered 2015-07-18: 510 mg via INTRAVENOUS
  Filled 2015-07-18: qty 17

## 2015-07-18 MED ORDER — SODIUM CHLORIDE 0.9 % IV SOLN
INTRAVENOUS | Status: AC
  Administered 2015-07-18: 14:00:00 via INTRAVENOUS
  Filled 2015-07-18: qty 1000

## 2015-07-19 ENCOUNTER — Ambulatory Visit: Payer: BLUE CROSS/BLUE SHIELD

## 2015-07-25 ENCOUNTER — Other Ambulatory Visit: Payer: Self-pay | Admitting: Obstetrics and Gynecology

## 2015-07-25 DIAGNOSIS — O3400 Maternal care for unspecified congenital malformation of uterus, unspecified trimester: Secondary | ICD-10-CM

## 2015-07-26 ENCOUNTER — Telehealth: Payer: Self-pay | Admitting: *Deleted

## 2015-07-26 NOTE — Telephone Encounter (Signed)
Would like to speak with [facility] regarding lab ordered and if she can change the appt

## 2015-07-26 NOTE — Telephone Encounter (Signed)
Patient's call returned. Patient's lab only moved to 11/18 per patient request.  The patient states that she will most likely get her cbc and ferritin at her primary care provider's office on 12/20. If so, she will have this copy of the labs brought with her to the 12/21 apt with Dr. Rogue Bussing. She will let us know what she decides. She works at [employer] and prefers to come at [employer] to see md. We discussed that having the labs drawn the day before will be helpful for Dr. Rogue Bussing.

## 2015-08-02 ENCOUNTER — Other Ambulatory Visit
Admission: RE | Admit: 2015-08-02 | Discharge: 2015-08-02 | Disposition: A | Discharge status: Home / Self Care | Payer: BLUE CROSS/BLUE SHIELD | Source: Ambulatory Visit | Attending: Internal Medicine | Admitting: Internal Medicine

## 2015-08-02 ENCOUNTER — Inpatient Hospital Stay: Payer: BLUE CROSS/BLUE SHIELD

## 2015-08-02 DIAGNOSIS — R079 Chest pain, unspecified: Secondary | ICD-10-CM | POA: Diagnosis present

## 2015-08-02 DIAGNOSIS — D508 Other iron deficiency anemias: Secondary | ICD-10-CM

## 2015-08-02 DIAGNOSIS — D509 Iron deficiency anemia, unspecified: Secondary | ICD-10-CM | POA: Diagnosis not present

## 2015-08-02 LAB — CBC WITH DIFFERENTIAL/PLATELET
BASOS ABS: 0.1 10*3/uL (ref 0–0.1)
BASOS PCT: 1 %
Eosinophils Absolute: 0.2 10*3/uL (ref 0–0.7)
Eosinophils Relative: 4 %
HEMATOCRIT: 37.7 % (ref 35.0–47.0)
HEMOGLOBIN: 12.7 g/dL (ref 12.0–16.0)
LYMPHS PCT: 29 %
Lymphs Abs: 1.8 10*3/uL (ref 1.0–3.6)
MCH: 28.6 pg (ref 26.0–34.0)
MCHC: 33.5 g/dL (ref 32.0–36.0)
MCV: 85.2 fL (ref 80.0–100.0)
MONO ABS: 0.5 10*3/uL (ref 0.2–0.9)
Monocytes Relative: 8 %
NEUTROS PCT: 58 %
Neutro Abs: 3.6 10*3/uL (ref 1.4–6.5)
Platelets: 405 10*3/uL (ref 150–440)
RBC: 4.43 MIL/uL (ref 3.80–5.20)
RDW: 19.9 % — ABNORMAL HIGH (ref 11.5–14.5)
WBC: 6.1 10*3/uL (ref 3.6–11.0)

## 2015-08-02 LAB — FIBRIN DERIVATIVES D-DIMER (ARMC ONLY): Fibrin derivatives D-dimer (ARMC): 394 (ref 0–499)

## 2015-08-06 ENCOUNTER — Ambulatory Visit
Admission: RE | Admit: 2015-08-06 | Discharge: 2015-08-06 | Disposition: A | Discharge status: Home / Self Care | Payer: BLUE CROSS/BLUE SHIELD | Source: Ambulatory Visit | Attending: Obstetrics and Gynecology | Admitting: Obstetrics and Gynecology

## 2015-08-06 DIAGNOSIS — D259 Leiomyoma of uterus, unspecified: Secondary | ICD-10-CM | POA: Insufficient documentation

## 2015-08-06 DIAGNOSIS — O3400 Maternal care for unspecified congenital malformation of uterus, unspecified trimester: Secondary | ICD-10-CM

## 2015-08-06 DIAGNOSIS — Q519 Congenital malformation of uterus and cervix, unspecified: Secondary | ICD-10-CM | POA: Insufficient documentation

## 2015-08-06 MED ORDER — GADOBENATE DIMEGLUMINE 529 MG/ML IV SOLN
20.0000 mL | Freq: Once | INTRAVENOUS | Status: AC | PRN
  Administered 2015-08-06: 20 mL via INTRAVENOUS

## 2015-08-06 NOTE — Addendum Note (Signed)
Addendum  created 08/06/15 1620 by Alvin Critchley, MD   Modules edited: Anesthesia Attestations

## 2015-08-07 ENCOUNTER — Other Ambulatory Visit: Payer: BLUE CROSS/BLUE SHIELD

## 2015-08-14 ENCOUNTER — Ambulatory Visit: Admission: RE | Admit: 2015-08-14 | Payer: BLUE CROSS/BLUE SHIELD | Source: Ambulatory Visit

## 2015-08-28 ENCOUNTER — Encounter: Payer: Self-pay | Admitting: Internal Medicine

## 2015-09-01 NOTE — Telephone Encounter (Signed)
I did call pt on Friday 12/16 and pt was telling me that she does not have to pay for labs at [facility] and when she comes to see MD at cancer center it cost her 50.00 copay.  Her last hgb was coming up and she does not mind coming if that is what she needs to do but she would like to get labs done at [facility].  She has just noticed that she has appt to see Dr B next week and it is too short notice to get off work so she would need to cancel the appt.  I spoke to Dr. Jacinto Reap and he states that we can send fax over to [place] her pa PCP.  They would get labs doen at [facility] and fax them to cancer center for review this week and Dr B. After reviewing them could decide if she needs to come over here in the future or her labs indicate the anemia has resolved and she does not have to come cancer center anymore. Pt is agreeable to above plan

## 2015-09-04 ENCOUNTER — Inpatient Hospital Stay: Payer: BLUE CROSS/BLUE SHIELD

## 2015-09-04 ENCOUNTER — Inpatient Hospital Stay: Payer: BLUE CROSS/BLUE SHIELD | Admitting: Internal Medicine

## 2015-09-04 ENCOUNTER — Ambulatory Visit: Payer: BLUE CROSS/BLUE SHIELD | Admitting: Internal Medicine

## 2015-09-06 ENCOUNTER — Telehealth: Payer: Self-pay | Admitting: *Deleted

## 2015-09-06 NOTE — Telephone Encounter (Signed)
Per v/o Dr. Rogue Bussing. Left msg for patient. Labs from [facility] (drawn on 09/02/15) have improved. hgb now 12.8 in comparison to the level of 8.5 (2 months ago)The patient needs to continue taking oral otc iron daily with orange juice. At this time, no followup needed with Dr. Tish Men.

## 2015-09-24 NOTE — H&P (Signed)
Kayla Shelton is a 30 y.o. female here for Discuss saline sono .h/o incompetent cx and 21 week fetal loss F/up for work up for possible uterine defect . MR done in nov 2016 show no defect but does show a 2 cm posterior fundal submucosal fibroid . Saline infusion sonohysterography: betadine prep to the cervix followed by placement of the HSG catheter into the endometrial canal . Sterile H2O is injected while performing a transvaginal u/s . Findings: 1.4x 1.4 cm Lower uterine segment endometrial mass c/w submucosal fibroid   Past Medical History:  has a past medical history of Anemia; Anxiety, unspecified; Depression; Dysmenorrhea; Dyspareunia; and HSV-1 (herpes simplex virus 1) infection.  Past Surgical History:  has a past surgical history that includes Tonsillectomy & Adenoidectomy; Dilation and curettage, diagnostic / therapeutic (06/26/2015); and Dilation and curettage of uterus (06/2015). Family History: family history includes COPD in her paternal grandmother; Dementia in her maternal grandmother; Diabetes mellitus in her father; Heart disease in her maternal grandfather and paternal grandfather; Hyperlipidemia in her brother, father, and paternal grandmother; Hypertension in her father and maternal grandmother. Social History:  reports that she has never smoked. She has never used smokeless tobacco. She reports that she drinks alcohol. OB/GYN History:  OB History    Gravida Para Term Preterm AB TAB SAB Ectopic Multiple Living   1 1 0 1 0 0 0 0 0 0       Allergies: is allergic to sulfa (sulfonamide antibiotics). Medications:  Current Outpatient Prescriptions:  . acetaminophen (TYLENOL) 325 MG tablet, Take 650 mg by mouth every 4 (four) hours as needed for Pain., Disp: , Rfl:  . albuterol 90 mcg/actuation inhaler, Inhale 2 inhalations into the lungs every 6 (six) hours as needed for Wheezing., Disp: 1 Inhaler, Rfl: 1 . ALPRAZolam (XANAX) 0.5 MG tablet, Take 0.5 mg by mouth 2 (two) times  daily as needed for Sleep., Disp: , Rfl:  . calcium carbonate (TUMS) 200 mg calcium (500 mg) chewable tablet, Take by mouth as needed., Disp: , Rfl:  . cetirizine (ZYRTEC) 10 MG tablet, Take by mouth as needed., Disp: , Rfl:  . chromium picolinate 200 mcg Cap, Take by mouth once daily., Disp: , Rfl:  . IBUPROFEN ORAL, Take 200 mg by mouth as needed. , Disp: , Rfl:  . naproxen (NAPROSYN) 500 MG tablet, Take 500 mg by mouth 2 (two) times daily with meals., Disp: , Rfl:  . azithromycin (ZITHROMAX) 250 MG tablet, Take 2 tablets (500mg ) by mouth on Day 1. Take 1 tablet (250mg ) by mouth on Days 2-5. (Patient not taking: Reported on 09/18/2015 ), Disp: 6 tablet, Rfl: 0 . cyclobenzaprine (FLEXERIL) 5 MG tablet, Take 1 tablet (5 mg total) by mouth 3 (three) times daily as needed for Muscle spasms. (Patient not taking: Reported on 06/20/2015 ), Disp: 15 tablet, Rfl: 0 . diazePAM (VALIUM) 5 MG tablet, Take 1 tablet (5 mg total) by mouth as directed for Anxiety. Take 2 tablets 30 min prior to procedure (Patient not taking: Reported on 09/18/2015 ), Disp: 2 tablet, Rfl: 0 . metFORMIN (GLUCOPHAGE) 500 MG tablet, Take 1 tablet (500 mg total) by mouth 2 (two) times daily with meals. (Patient not taking: Reported on 06/20/2015 ), Disp: 60 tablet, Rfl: 5 . nitrofurantoin, macrocrystal-monohydrate, (MACROBID) 100 MG capsule, Take 1 capsule (100 mg total) by mouth 2 (two) times daily. (Patient not taking: Reported on 07/11/2015 ), Disp: 14 capsule, Rfl: 0 . oxyCODONE (ROXICODONE) 5 MG immediate release tablet, Reported on 09/18/2015 , Disp: ,  Rfl:  . PNV62/FA/OM3/DHA/EPA/FISH OIL (PRENATAL GUMMY ORAL), Take by mouth., Disp: , Rfl:  . tamsulosin (FLOMAX) 0.4 mg capsule, Take 1 capsule (0.4 mg total) by mouth once daily. (Patient not taking: Reported on 06/20/2015 ), Disp: 30 capsule, Rfl: 1  Review of Systems: General:   No fatigue or weight loss Eyes:   No vision changes Ears:   No hearing difficulty Respiratory:   No cough  or shortness of breath Pulmonary:   No asthma or shortness of breath Cardiovascular:  No chest pain, palpitations, dyspnea on exertion Gastrointestinal:  No abdominal bloating, chronic diarrhea, constipations, masses, pain or hematochezia Genitourinary:  No hematuria, dysuria, abnormal vaginal discharge, pelvic pain, Menometrorrhagia Lymphatic:  No swollen lymph nodes Musculoskeletal: No muscle weakness Neurologic:  No extremity weakness, syncope, seizure disorder Psychiatric:  No history of depression, delusions or suicidal/homicidal ideation   Exam:      Vitals:   09/18/15 1620  BP: 120/85  Pulse: 91    Body mass index is 45.56 kg/(m^2).  WDWN white/female in NAD  Lungs: CTA  CV : RRR without murmur   Neck: no thyromegaly Abdomen: soft , no mass, normal active bowel sounds, non-tender, no rebound tenderness Pelvic: tanner stage 5 ,  External genitalia: vulva /labia no lesions Urethra: no prolapse Vagina: normal physiologic d/c Cervix: no lesions, no cervical motion tenderness  Uterus: normal size shape and contour, non-tender Adnexa: no mass, non-tender  Rectovaginal:  Saline infusion sonohysterography: betadine prep to the cervix followed by placement of the HSG catheter into the endometrial canal . Sterile H2O is injected while performing a transvaginal u/s . 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  Submucosal fibroid may increase her risk for SAB. Probably does not have much bearing on the incompetent cervical issue No evidence of mullerian defect on repeat MRI     Plan:   Recommend H/S removal of the fibroid with myosure  Pt agrees to the procedure . Brief spoke to her regarding the procedure and the risks     Return if symptoms worsen or fail to  improve, for preop.  Caroline Sauger, MD       Electronically signed by Caroline Sauger, MD at 09/18/2015 4:32 PM      Office Visit on 09/18/2015      Department  Name Address Phone Fax  [facility] [place] [address] [phone]  Service Location  Name Address      [address] [place] [address]

## 2015-09-26 ENCOUNTER — Encounter
Admission: RE | Admit: 2015-09-26 | Discharge: 2015-09-26 | Disposition: A | Discharge status: Home / Self Care | Payer: Managed Care, Other (non HMO) | Source: Ambulatory Visit | Attending: Obstetrics and Gynecology | Admitting: Obstetrics and Gynecology

## 2015-09-26 DIAGNOSIS — Z01812 Encounter for preprocedural laboratory examination: Secondary | ICD-10-CM | POA: Diagnosis present

## 2015-09-26 LAB — TYPE AND SCREEN
ABO/RH(D): A POS
Antibody Screen: NEGATIVE

## 2015-09-26 LAB — CBC
HEMATOCRIT: 41.3 % (ref 35.0–47.0)
Hemoglobin: 13.6 g/dL (ref 12.0–16.0)
MCH: 27.4 pg (ref 26.0–34.0)
MCHC: 32.8 g/dL (ref 32.0–36.0)
MCV: 83.4 fL (ref 80.0–100.0)
Platelets: 413 10*3/uL (ref 150–440)
RBC: 4.95 MIL/uL (ref 3.80–5.20)
RDW: 15.8 % — AB (ref 11.5–14.5)
WBC: 8 10*3/uL (ref 3.6–11.0)

## 2015-09-26 LAB — BASIC METABOLIC PANEL
Anion gap: 6 (ref 5–15)
BUN: 12 mg/dL (ref 6–20)
CHLORIDE: 103 mmol/L (ref 101–111)
CO2: 28 mmol/L (ref 22–32)
CREATININE: 0.63 mg/dL (ref 0.44–1.00)
Calcium: 9.5 mg/dL (ref 8.9–10.3)
GFR calc Af Amer: >60 mL/min (ref >60)
GFR calc non Af Amer: >60 mL/min (ref >60)
Glucose, Bld: 91 mg/dL (ref 65–99)
POTASSIUM: 3.8 mmol/L (ref 3.5–5.1)
SODIUM: 137 mmol/L (ref 135–145)

## 2015-09-26 NOTE — Patient Instructions (Signed)
  Your procedure is scheduled on: Friday 10/04/15 Report to Day Surgery. 2ND FLOOR MEDICAL MALL ENTRANCE To find out your arrival time please call [phone] between 1PM - 3PM on  Thursday 10/03/15.  Remember: Instructions that are not followed completely may result in serious medical risk, up to and including death, or upon the discretion of your surgeon and anesthesiologist your surgery may need to be rescheduled.    __X__ 1. Do not eat food or drink liquids after midnight. No gum chewing or hard candies.     __X__ 2. No Alcohol for 24 hours before or after surgery.   ____ 3. Bring all medications with you on the day of surgery if instructed.    __X__ 4. Notify your doctor if there is any change in your medical condition     (cold, fever, infections).     Do not wear jewelry, make-up, hairpins, clips or nail polish.  Do not wear lotions, powders, or perfumes. You may wear deodorant.  Do not shave 48 hours prior to surgery. Men may shave face and neck.  Do not bring valuables to the hospital.    [facility] is not responsible for any belongings or valuables.               Contacts, dentures or bridgework may not be worn into surgery.  Leave your suitcase in the car. After surgery it may be brought to your room.  For patients admitted to the hospital, discharge time is determined by your                treatment team.   Patients discharged the day of surgery will not be allowed to drive home.   Please read over the following fact sheets that you were given:   Surgical Site Infection Prevention   __X__ Take these medicines the morning of surgery with A SIP OF WATER:    1. ALPRAZOLAM IF NEEDED  2.   3.   4.  5.  6.  ____ Fleet Enema (as directed)   ____ Use CHG Soap as directed  ____ Use inhalers on the day of surgery  ____ Stop metformin 2 days prior to surgery    ____ Take 1/2 of usual insulin dose the night before surgery and none on the morning of surgery.   ____  Stop Coumadin/Plavix/aspirin on   __X__ Stop Anti-inflammatories on TODAY  (NAPROSYN)   ____ Stop supplements until after surgery.    ____ Bring C-Pap to the hospital.

## 2015-10-04 ENCOUNTER — Ambulatory Visit
Admission: RE | Admit: 2015-10-04 | Discharge: 2015-10-04 | Disposition: A | Discharge status: Home / Self Care | Payer: Managed Care, Other (non HMO) | Source: Ambulatory Visit | Attending: Obstetrics and Gynecology | Admitting: Obstetrics and Gynecology

## 2015-10-04 ENCOUNTER — Ambulatory Visit: Payer: Managed Care, Other (non HMO) | Admitting: Anesthesiology

## 2015-10-04 ENCOUNTER — Encounter: Payer: Self-pay | Admitting: *Deleted

## 2015-10-04 ENCOUNTER — Encounter
Admission: RE | Disposition: A | Discharge status: Home / Self Care | Payer: Self-pay | Source: Ambulatory Visit | Attending: Obstetrics and Gynecology

## 2015-10-04 DIAGNOSIS — Z882 Allergy status to sulfonamides status: Secondary | ICD-10-CM | POA: Diagnosis not present

## 2015-10-04 DIAGNOSIS — F419 Anxiety disorder, unspecified: Secondary | ICD-10-CM | POA: Insufficient documentation

## 2015-10-04 DIAGNOSIS — Z7984 Long term (current) use of oral hypoglycemic drugs: Secondary | ICD-10-CM | POA: Insufficient documentation

## 2015-10-04 DIAGNOSIS — N840 Polyp of corpus uteri: Secondary | ICD-10-CM | POA: Diagnosis present

## 2015-10-04 DIAGNOSIS — Z818 Family history of other mental and behavioral disorders: Secondary | ICD-10-CM | POA: Insufficient documentation

## 2015-10-04 DIAGNOSIS — Z833 Family history of diabetes mellitus: Secondary | ICD-10-CM | POA: Insufficient documentation

## 2015-10-04 DIAGNOSIS — F329 Major depressive disorder, single episode, unspecified: Secondary | ICD-10-CM | POA: Insufficient documentation

## 2015-10-04 DIAGNOSIS — Z7951 Long term (current) use of inhaled steroids: Secondary | ICD-10-CM | POA: Insufficient documentation

## 2015-10-04 DIAGNOSIS — Z79899 Other long term (current) drug therapy: Secondary | ICD-10-CM | POA: Diagnosis not present

## 2015-10-04 DIAGNOSIS — Z87442 Personal history of urinary calculi: Secondary | ICD-10-CM | POA: Insufficient documentation

## 2015-10-04 DIAGNOSIS — N946 Dysmenorrhea, unspecified: Secondary | ICD-10-CM | POA: Insufficient documentation

## 2015-10-04 DIAGNOSIS — Z8249 Family history of ischemic heart disease and other diseases of the circulatory system: Secondary | ICD-10-CM | POA: Diagnosis not present

## 2015-10-04 DIAGNOSIS — Z825 Family history of asthma and other chronic lower respiratory diseases: Secondary | ICD-10-CM | POA: Diagnosis not present

## 2015-10-04 DIAGNOSIS — D649 Anemia, unspecified: Secondary | ICD-10-CM | POA: Diagnosis not present

## 2015-10-04 HISTORY — PX: DILATATION & CURETTAGE/HYSTEROSCOPY WITH MYOSURE: SHX6511

## 2015-10-04 LAB — POCT PREGNANCY, URINE: PREG TEST UR: NEGATIVE

## 2015-10-04 SURGERY — DILATATION & CURETTAGE/HYSTEROSCOPY WITH MYOSURE
Anesthesia: General

## 2015-10-04 MED ORDER — ONDANSETRON HCL 4 MG/2ML IJ SOLN
INTRAMUSCULAR | Status: DC | PRN
  Administered 2015-10-04: 4 mg via INTRAVENOUS

## 2015-10-04 MED ORDER — FAMOTIDINE 20 MG PO TABS
ORAL_TABLET | ORAL | Status: AC
  Filled 2015-10-04: qty 1

## 2015-10-04 MED ORDER — LACTATED RINGERS IV SOLN: INTRAVENOUS | Status: DC

## 2015-10-04 MED ORDER — FENTANYL CITRATE (PF) 100 MCG/2ML IJ SOLN
25.0000 ug | INTRAMUSCULAR | Status: DC | PRN
  Administered 2015-10-04 (×4): 25 ug via INTRAVENOUS

## 2015-10-04 MED ORDER — DEXAMETHASONE SODIUM PHOSPHATE 10 MG/ML IJ SOLN
INTRAMUSCULAR | Status: DC | PRN
  Administered 2015-10-04: 4 mg via INTRAVENOUS

## 2015-10-04 MED ORDER — CEFOXITIN SODIUM-DEXTROSE 2-2.2 GM-% IV SOLR (PREMIX)
2.0000 g | INTRAVENOUS | Status: AC
  Administered 2015-10-04: 2000 mg via INTRAVENOUS

## 2015-10-04 MED ORDER — FENTANYL CITRATE (PF) 100 MCG/2ML IJ SOLN
INTRAMUSCULAR | Status: DC | PRN
  Administered 2015-10-04 (×2): 50 ug via INTRAVENOUS

## 2015-10-04 MED ORDER — FAMOTIDINE 20 MG PO TABS
20.0000 mg | ORAL_TABLET | Freq: Once | ORAL | Status: AC
  Administered 2015-10-04: 20 mg via ORAL

## 2015-10-04 MED ORDER — MIDAZOLAM HCL 2 MG/2ML IJ SOLN
INTRAMUSCULAR | Status: DC | PRN
  Administered 2015-10-04: 2 mg via INTRAVENOUS

## 2015-10-04 MED ORDER — CEFOXITIN SODIUM-DEXTROSE 2-2.2 GM-% IV SOLR (PREMIX)
INTRAVENOUS | Status: AC
  Filled 2015-10-04: qty 50

## 2015-10-04 MED ORDER — SILVER NITRATE-POT NITRATE 75-25 % EX MISC
CUTANEOUS | Status: DC | PRN
  Administered 2015-10-04: 5 via TOPICAL

## 2015-10-04 MED ORDER — LACTATED RINGERS IV SOLN
INTRAVENOUS | Status: DC
  Administered 2015-10-04: 06:00:00 via INTRAVENOUS

## 2015-10-04 MED ORDER — LACTATED RINGERS IV SOLN
INTRAVENOUS | Status: DC | PRN
  Administered 2015-10-04 (×2): via INTRAVENOUS

## 2015-10-04 MED ORDER — ONDANSETRON HCL 4 MG/2ML IJ SOLN: 4.0000 mg | Freq: Once | INTRAMUSCULAR | Status: DC | PRN

## 2015-10-04 MED ORDER — FENTANYL CITRATE (PF) 100 MCG/2ML IJ SOLN
INTRAMUSCULAR | Status: AC
  Administered 2015-10-04: 25 ug via INTRAVENOUS
  Filled 2015-10-04: qty 2

## 2015-10-04 SURGICAL SUPPLY — 19 items
CANISTER SUC SOCK COL 7IN (MISCELLANEOUS) ×3 IMPLANT
CANISTER SUCT 3000ML (MISCELLANEOUS) ×3 IMPLANT
CATH ROBINSON RED A/P 16FR (CATHETERS) ×3 IMPLANT
GLOVE BIO SURGEON STRL SZ8 (GLOVE) ×9 IMPLANT
GOWN STRL REUS W/ TWL LRG LVL3 (GOWN DISPOSABLE) ×1 IMPLANT
GOWN STRL REUS W/ TWL XL LVL3 (GOWN DISPOSABLE) ×1 IMPLANT
GOWN STRL REUS W/TWL LRG LVL3 (GOWN DISPOSABLE) ×2
GOWN STRL REUS W/TWL XL LVL3 (GOWN DISPOSABLE) ×2
KIT RM TURNOVER CYSTO AR (KITS) ×3 IMPLANT
MYOSURE LITE POLYP REMOVAL (MISCELLANEOUS) ×3 IMPLANT
PACK DNC HYST (MISCELLANEOUS) ×3 IMPLANT
PAD OB MATERNITY 4.3X12.25 (PERSONAL CARE ITEMS) ×3 IMPLANT
PAD PREP 24X41 OB/GYN DISP (PERSONAL CARE ITEMS) ×3 IMPLANT
SOL .9 NS 3000ML IRR  AL (IV SOLUTION) ×2
SOL .9 NS 3000ML IRR UROMATIC (IV SOLUTION) ×1 IMPLANT
TOWEL OR 17X26 4PK STRL BLUE (TOWEL DISPOSABLE) ×3 IMPLANT
TUBING CONNECTING 10 (TUBING) ×2 IMPLANT
TUBING CONNECTING 10' (TUBING) ×1
TUBING HYSTEROSCOPY DOLPHIN (MISCELLANEOUS) ×3 IMPLANT

## 2015-10-04 NOTE — Brief Op Note (Signed)
10/04/2015  8:22 AM  PATIENT:  Kayla Shelton  30 y.o. female  PRE-OPERATIVE DIAGNOSIS:  submucosal fibroid  POST-OPERATIVE DIAGNOSIS:  Multiple endometrial polyps PROCEDURE:  hysteroscopic resection of endometrial polyps with myosure   SURGEON:  Surgeon(s) and Role:    Boykin Nearing, MD - Primary  PHYSICIAN ASSISTANT:   ASSISTANTS: none   ANESTHESIA:   general  EBL:10 cc BLOOD ADMINISTERED:none  DRAINS: none   LOCAL MEDICATIONS USED:  NONE  SPECIMEN:  Source of Specimen:  endometrial polyp  DISPOSITION OF SPECIMEN:  PATHOLOGY  COUNTS:  YES  TOURNIQUET:  * No tourniquets in log *  DICTATION: .Other Dictation: Dictation Number verbal   PLAN OF CARE: Discharge to home after PACU  PATIENT DISPOSITION:  PACU - hemodynamically stable.   Delay start of Pharmacological VTE agent (>24hrs) due to surgical blood loss or risk of bleeding: not applicable

## 2015-10-04 NOTE — Transfer of Care (Signed)
Immediate Anesthesia Transfer of Care Note  Patient: Kayla Shelton  Procedure(s) Performed: Procedure(s): DILATATION & CURETTAGE/HYSTEROSCOPY WITH MYOSURE (N/A)  Patient Location: PACU  Anesthesia Type:General  Level of Consciousness: awake, alert  and oriented  Airway & Oxygen Therapy: Patient Spontanous Breathing and Patient connected to nasal cannula oxygen  Post-op Assessment: Report given to RN and Post -op Vital signs reviewed and stable  Post vital signs: Reviewed and stable  Last Vitals:  Filed Vitals:   10/04/15 0605 10/04/15 0833  BP: 117/78 121/84  Pulse: 91 87  Temp: 36.2 C 36.1 C  Resp: 16 19    Complications: No apparent anesthesia complications

## 2015-10-04 NOTE — Op Note (Signed)
[name], [name]                 ACCOUNT NO.:  [id number]  MEDICAL RECORD NO.:  [id number]  LOCATION:  [facility]                         FACILITY:  [facility]  PHYSICIAN:  Laverta Baltimore, MDDATE OF BIRTH:  Feb 10, 1986  DATE OF PROCEDURE: DATE OF DISCHARGE:                              OPERATIVE REPORT   PREOPERATIVE DIAGNOSIS:  Endometrial submucosal fibroid.  POSTOPERATIVE DIAGNOSIS:  Endometrial polyps, multiple.  PROCEDURE PERFORMED:  Hysteroscopic resection of endometrial polyps with MyoSure.  SURGEON:  Laverta Baltimore, MD  ANESTHESIA:  General endotracheal anesthesia.  SURGEON:  Laverta Baltimore, MD  INDICATIONS:  A 30 year old, gravida 1, para 1, with a 21 week fetal loss.  In the past, the patient underwent a saline infusion sonohysterography in the office that showed a 1.4 x 1.4 cm lower uterine segment endometrial mass consistent with submucosal fibroid.  DESCRIPTION OF PROCEDURE:  After adequate general endotracheal anesthesia, patient was placed in dorsal supine position with the legs in the candy-cane stirrups.  Lower abdomen, perineum, and vagina were prepped.  The patient was draped in a sterile fashion.  The patient did receive 2 g IV cefoxitin prior to commencement of case.  Time-out was performed.  The patient's bladder was then catheterized with a red Robinson catheter yielding 100 mL of urine.  The patient was placed in the Trendelenburg position.  A weighted speculum was placed in the posterior vaginal vault and the anterior cervix was grasped with a single-tooth tenaculum.  Uterus sounded to 7 cm.  Cervix was then dilated to #13 Hanks dilator.  Minimal resistance was noted upon entry of the dilators.  This may be consistent with the clinical history of a possible incompetent cervix.  Hysteroscope was advanced into the endometrial cavity.  Normal saline used as distending medium.  Multiple endometrial polyps were identified.  The MyoSure was brought  up and advanced into the endometrial cavity and MyoSure was used to resect the endometrial polyps.  Pictures were taken before and after.  Good hemostasis was noted.  Net deficit of the normal saline 40 mL.  Single- tooth tenaculum was removed from the cervix and silver nitrate was used to control bleeding from the tenaculum sites.  The patient tolerated the procedure well.  There were no complications.  ESTIMATED BLOOD LOSS:  10 mL.  INTRAOPERATIVE FLUIDS:  1 L.  URINE OUTPUT:  100 mL and net deficit, normal saline 40 mL.  The patient was taken to recovery in good condition.          ______________________________ Laverta Baltimore, MD     TS/MEDQ  D:  10/04/2015  T:  10/04/2015  Job:  [id number]

## 2015-10-04 NOTE — Anesthesia Preprocedure Evaluation (Signed)
Anesthesia Evaluation  Patient identified by MRN, date of birth, ID band Patient awake    Reviewed: Allergy & Precautions, NPO status , Patient's Chart, lab work & pertinent test results  History of Anesthesia Complications Negative for: history of anesthetic complications  Airway Mallampati: III       Dental   Pulmonary neg pulmonary ROS,           Cardiovascular negative cardio ROS       Neuro/Psych Anxiety Depression negative neurological ROS     GI/Hepatic Neg liver ROS, GERD  ,  Endo/Other  negative endocrine ROS  Renal/GU Renal disease (stones)     Musculoskeletal   Abdominal   Peds  Hematology  (+) anemia ,   Anesthesia Other Findings   Reproductive/Obstetrics                             Anesthesia Physical Anesthesia Plan  ASA: II  Anesthesia Plan:    Post-op Pain Management:    Induction: Intravenous  Airway Management Planned: LMA  Additional Equipment:   Intra-op Plan:   Post-operative Plan:   Informed Consent: I have reviewed the patients History and Physical, chart, labs and discussed the procedure including the risks, benefits and alternatives for the proposed anesthesia with the patient or authorized representative who has indicated his/her understanding and acceptance.     Plan Discussed with:   Anesthesia Plan Comments:         Anesthesia Quick Evaluation

## 2015-10-04 NOTE — H&P (Signed)
Pt interviewed , labs reviewed . All questions answered . Proceed with hysteroscopy and resection fibroid

## 2015-10-04 NOTE — Anesthesia Postprocedure Evaluation (Signed)
Anesthesia Post Note  Patient: Kayla Shelton  Procedure(s) Performed: Procedure(s) (LRB): DILATATION & CURETTAGE/HYSTEROSCOPY WITH MYOSURE, hysteroscopic resection of polyp (N/A)  Patient location during evaluation: PACU Anesthesia Type: General Level of consciousness: awake and alert Pain management: pain level controlled Vital Signs Assessment: post-procedure vital signs reviewed and stable Respiratory status: spontaneous breathing and respiratory function stable Cardiovascular status: stable Anesthetic complications: no    Last Vitals:  Filed Vitals:   10/04/15 0930 10/04/15 0957  BP: 128/87 114/67  Pulse: 85   Temp: 35.9 C   Resp: 14     Last Pain:  Filed Vitals:   10/04/15 0958  PainSc: 2                  Kayla Shelton

## 2015-10-07 LAB — SURGICAL PATHOLOGY

## 2016-07-09 IMAGING — MR MR PELVIS WO/W CM
5 of 10 series · 17 of 48 positions shown · IV contrast (multihance)
Comparison: MRI on 06/26/2015

CLINICAL DATA: Recent pre term labor and miscarriage. Evaluate for
congenital uterine Mullerian duct anomaly.

EXAM:
MRI PELVIS WITHOUT AND WITH CONTRAST
TECHNIQUE: Multiplanar multisequence MR imaging of the pelvis was performed
both before and after administration of intravenous contrast.
CONTRAST:  20mL MULTIHANCE GADOBENATE DIMEGLUMINE 529 MG/ML IV SOLN

[Series 3: cor ssfse / · coronal · 6.0mm · 1.56mm/px · 3 of 30 slices shown]
[im 1/30]
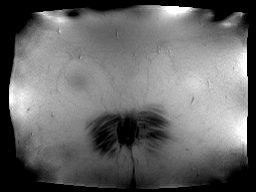
[im 15/30]
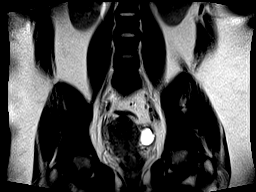
[im 30/30]
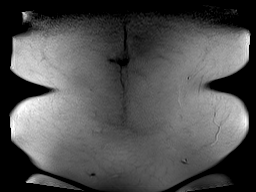

[Series 4: T2 · axial · 5.0mm · 0.55mm/px · z∈[-123,+31]mm · 3 of 29 slices shown (1 of 2)]
[im 1/29]
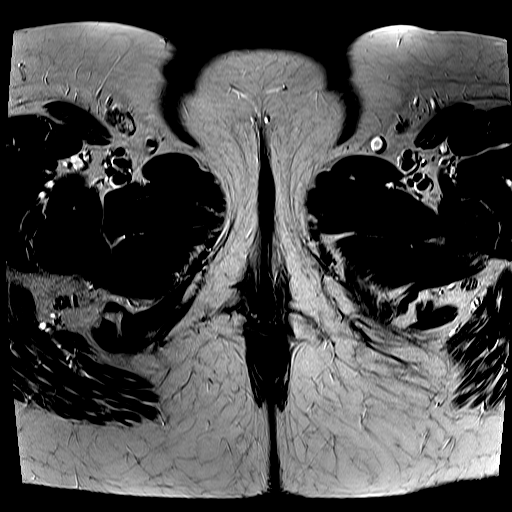
[im 15/29]
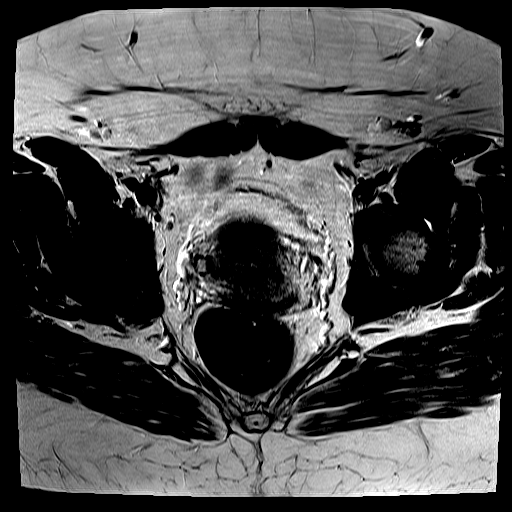
[im 29/29]
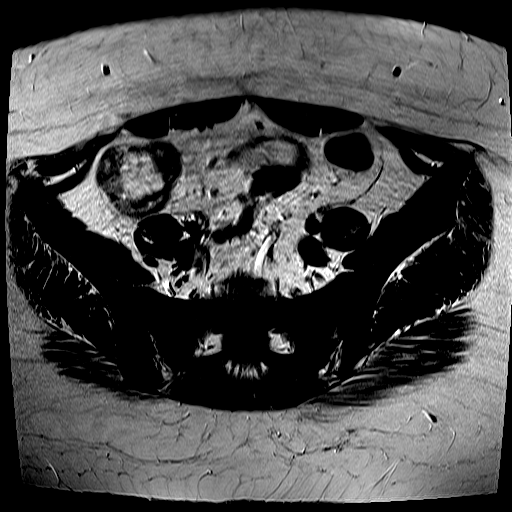

[Series 5: T2 fat-sat · axial · 5.0mm · 0.55mm/px · z∈[-123,+31]mm · 3 of 29 slices shown]
[im 1/29]
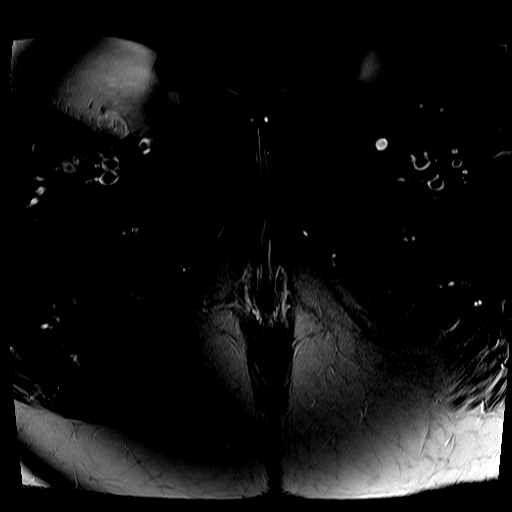
[im 15/29]
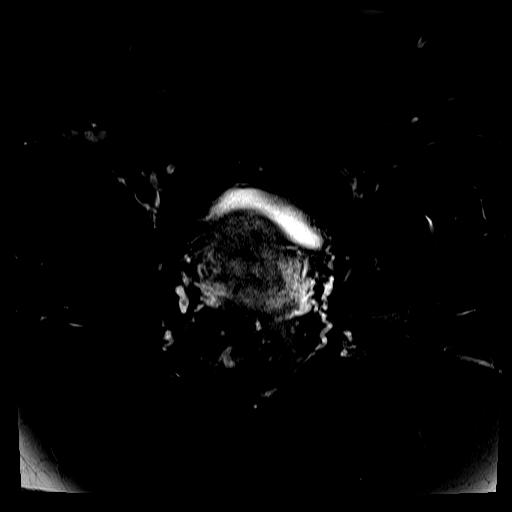
[im 29/29]
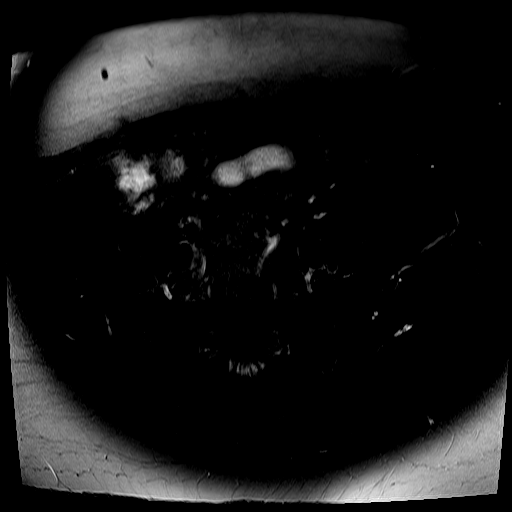

[Series 6: T2 · sagittal · 5.0mm · 0.55mm/px · 3 of 23 slices shown (2 of 2)]
[im 1/23]
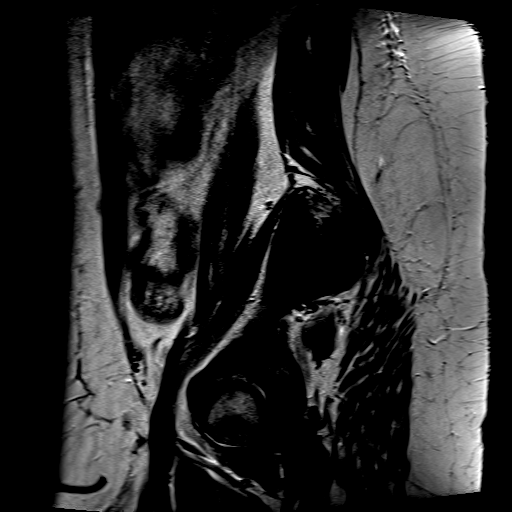
[im 12/23]
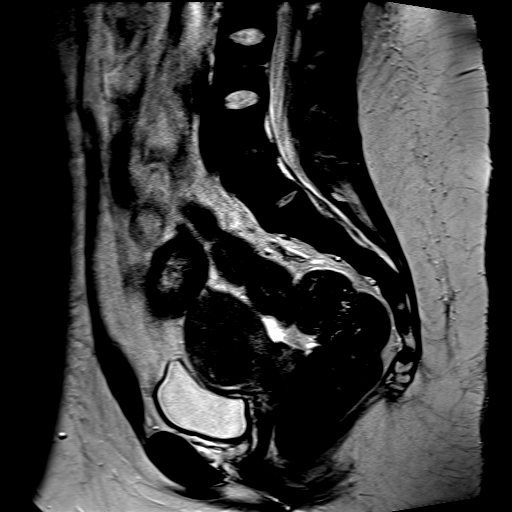
[im 23/23]
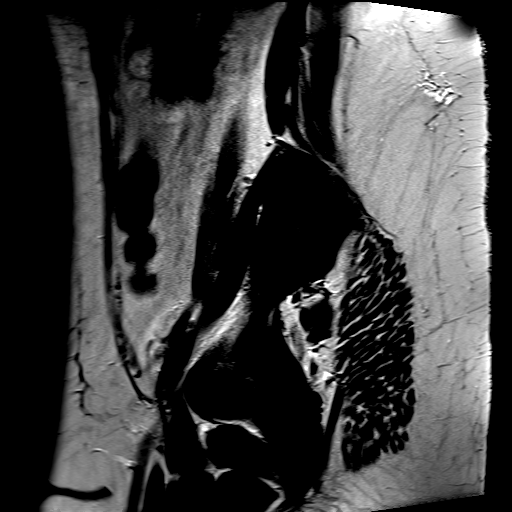

[Series 7: T1 · axial · 5.0mm · 0.34mm/px · z∈[-125,-37]mm · 5 of 60 slices shown]
[im 1/60]
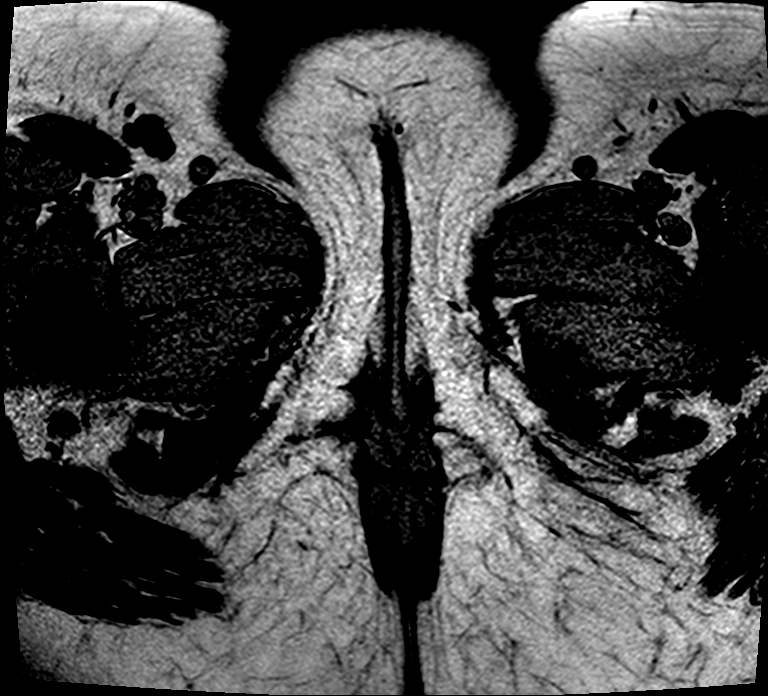
[im 9/60]
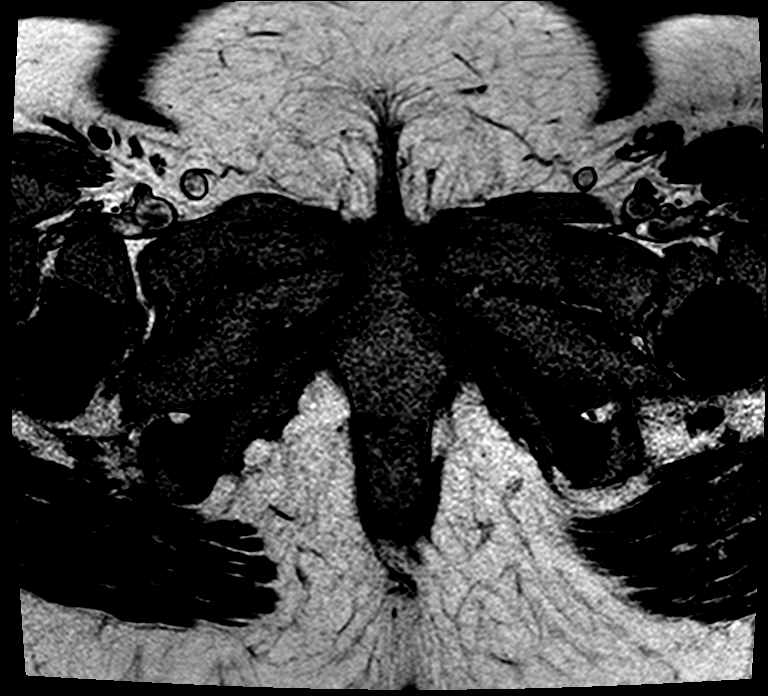
[im 17/60]
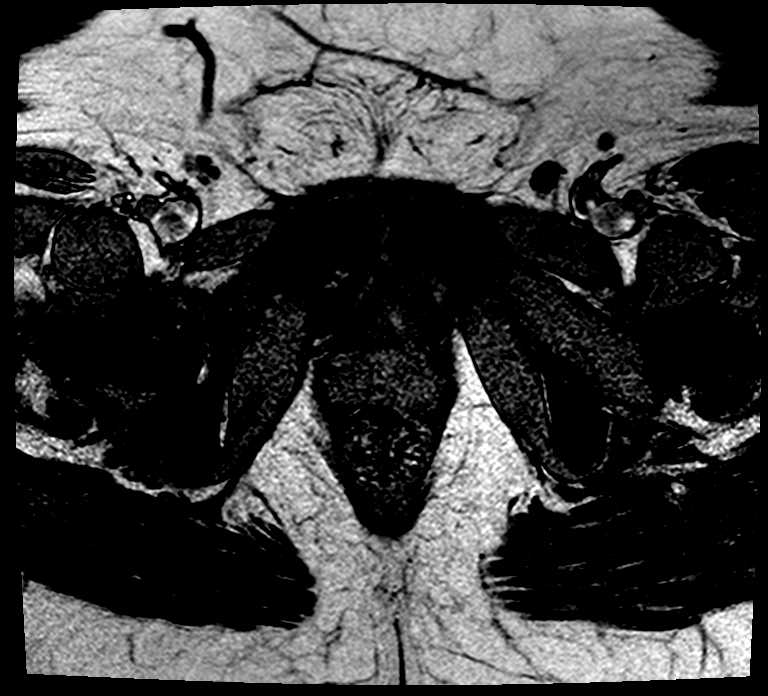
[im 26/60]
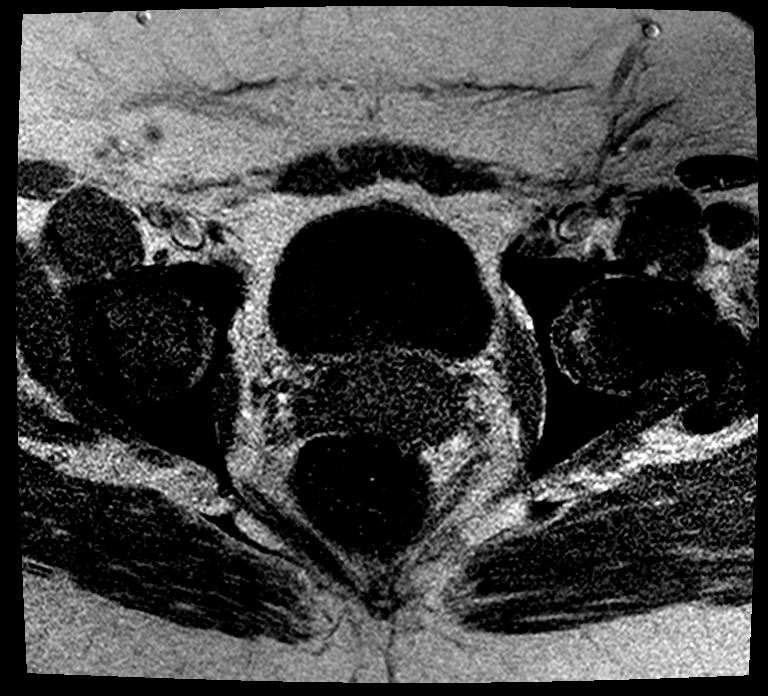
[im 34/60]
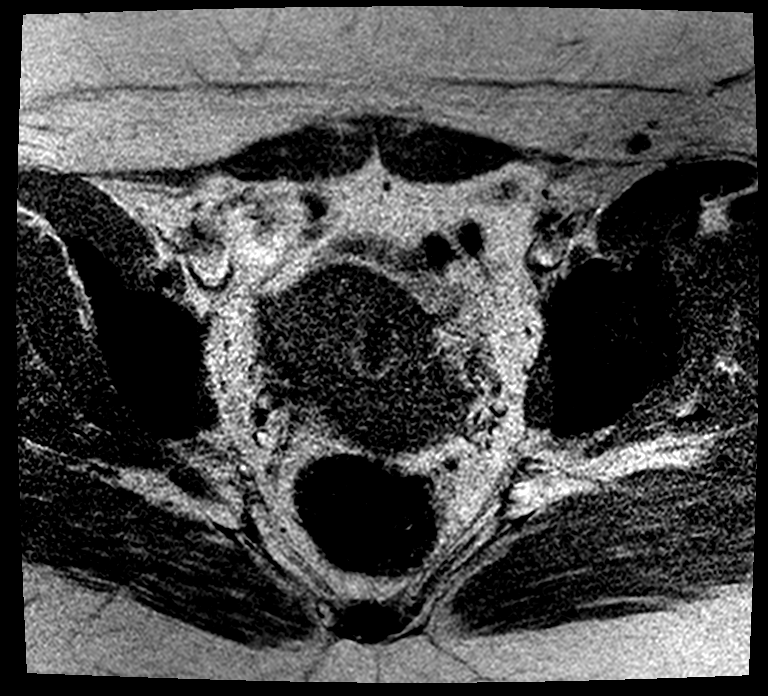

[17 of 48 positions shown; findings below may reference images not displayed]

FINDINGS: Uterus: Measures 7.6 x 4.7 x 5.6 cm. A 2.8 cm fibroid is seen in the
anterior corpus which has a submucosal component indenting the
endometrial stripe. A subserosal fibroid is seen in the posterior
fundus measuring 2.0 cm. Smooth fundal contour of the uterus is seen
without evidence of fundal myometrial cleft.

Endometrium:  Thin and unremarkable.

Cervix:  Appears normal.

Right ovary: Appears normal.  No adnexal mass identified.

Left ovary: 2.7 cm follicle noted. Otherwise normal appearance. No
adnexal mass identified.

Urinary Bladder:  Unremarkable.

Bowel: Visualized pelvic loops are unremarkable.

Lymph Nodes:  No pathologically enlarged lymph nodes identified.

Other: Tiny amount of free fluid noted in pelvic cul-de-sac.
IMPRESSION: No evidence of Mullerian duct anomaly.

Small uterine fibroids, as described above.

Normal appearance of both ovaries.  No adnexal mass identified.

## 2016-09-14 NOTE — L&D Delivery Note (Addendum)
Delivery Note At 8:28 on 8/3/0/18 PM a viable female was delivered via Vaginal, Spontaneous Delivery (Presentation: vtx LOA;  ).  APGAR: 8, 9; weight 8 lb 1.5 oz (3670 g).   Placenta status intact : , .  Cord:3 v delayed cord clamping   with the following complications: .None  Cord pH: not done  Anesthesia:  cle Episiotomy: None Lacerations: 2nd degree Suture Repair: 2.0 3.0 vicryl Est. Blood Loss (mL):  300 cc  Mom to postpartum.  Baby to Couplet care / Skin to Skin.  [name] Ireoluwa Gorsline 05/13/2017, 9:04 PM

## 2016-10-23 LAB — OB RESULTS CONSOLE HIV ANTIBODY (ROUTINE TESTING): HIV: NONREACTIVE

## 2016-10-23 LAB — OB RESULTS CONSOLE VARICELLA ZOSTER ANTIBODY, IGG: VARICELLA IGG: IMMUNE

## 2016-10-23 LAB — OB RESULTS CONSOLE HEPATITIS B SURFACE ANTIGEN: Hepatitis B Surface Ag: NEGATIVE

## 2016-10-23 LAB — OB RESULTS CONSOLE RUBELLA ANTIBODY, IGM: RUBELLA: IMMUNE

## 2016-11-17 ENCOUNTER — Emergency Department: Payer: Managed Care, Other (non HMO)

## 2016-11-17 ENCOUNTER — Emergency Department
Admission: EM | Admit: 2016-11-17 | Discharge: 2016-11-18 | Disposition: A | Discharge status: Home / Self Care | Payer: Managed Care, Other (non HMO) | Attending: Emergency Medicine | Admitting: Emergency Medicine

## 2016-11-17 DIAGNOSIS — O26891 Other specified pregnancy related conditions, first trimester: Secondary | ICD-10-CM | POA: Insufficient documentation

## 2016-11-17 DIAGNOSIS — M545 Low back pain: Secondary | ICD-10-CM | POA: Diagnosis not present

## 2016-11-17 DIAGNOSIS — Z3A13 13 weeks gestation of pregnancy: Secondary | ICD-10-CM | POA: Diagnosis not present

## 2016-11-17 DIAGNOSIS — R1030 Lower abdominal pain, unspecified: Secondary | ICD-10-CM | POA: Diagnosis not present

## 2016-11-17 DIAGNOSIS — O26879 Cervical shortening, unspecified trimester: Secondary | ICD-10-CM

## 2016-11-17 NOTE — ED Provider Notes (Signed)
[facility] Emergency Department Provider Note  Time seen: 9:40 PM  I have reviewed the triage vital signs and the nursing notes.   HISTORY  Chief Complaint Abdominal Pain    HPI Kayla Shelton is a 31 y.o. female G2 P0 A1 approximately [redacted] weeks pregnant who presents to the emergency department with lower back cramping. According to the patient she had a prior miscarriage due to an incompetent cervix. She has been experiencing some lower abdominal cramping over the past several weeks but tonight was experiencing cramping in her lower back which she states she had during her last miscarriage. This concerned her so she called her OB physician who scheduled her for an ultrasound tomorrow as stated if he got worse to go to the emergency department tonight. Patient states the cramping got worse so she came to the emergency department. Denies any discharge or bleeding. Currently states the cramping is very mild.Patient states there is a plan in place to place a cerclage in the next several weeks.  Past Medical History:  Diagnosis Date  . Anemia   . Anxiety   . Bladder infection   . Constipation   . Depression   . GERD (gastroesophageal reflux disease)   . Infertility, female   . Kidney stones   . Obesity affecting pregnancy     Patient Active Problem List   Diagnosis Date Noted  . [redacted] weeks gestation of pregnancy 07/10/2015  . Other iron deficiency anemia 07/10/2015  . Preterm labor 06/25/2015  . Cervical insufficiency during pregnancy in second trimester, antepartum 06/22/2015  . Vaginal bleeding before [redacted] weeks gestation 06/20/2015  . Abortion, inevitable 06/20/2015  . Morbid (severe) obesity due to excess calories ([place]) 05/03/2015  . 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 Abnormal uterine bleeding 07/23/2014    Past Surgical History:  Procedure Laterality Date  . ADENOIDECTOMY    . DILATATION & CURETTAGE/HYSTEROSCOPY WITH MYOSURE N/A 10/04/2015    Procedure: DILATATION & CURETTAGE/HYSTEROSCOPY WITH MYOSURE, hysteroscopic resection of polyp;  Surgeon: Boykin Nearing, MD;  Location: [facility] ORS;  Service: Gynecology;  Laterality: N/A;  . DILATION AND EVACUATION N/A 06/25/2015   Procedure: DILATATION AND EVACUATION;  Surgeon: Boykin Nearing, MD;  Location: [facility] ORS;  Service: Gynecology;  Laterality: N/A;  . hematoma on neck removed    . MYRINGOTOMY WITH TUBE PLACEMENT    . TONSILLECTOMY      Prior to Admission medications   Medication Sig Start Date End Date Taking? Authorizing Provider  acetaminophen (TYLENOL) 500 MG tablet Take 1,000 mg by mouth every 6 (six) hours as needed for mild pain or headache.    Historical Provider, MD  ALPRAZolam Duanne Moron) 0.5 MG tablet Take 1 tablet (0.5 mg total) by mouth 2 (two) times daily as needed for anxiety. 06/27/15   [name] Schermerhorn, MD  diphenhydrAMINE (BENADRYL) 50 MG capsule Take 50 mg by mouth every 6 (six) hours as needed.    Historical Provider, MD  naproxen (NAPROSYN) 500 MG tablet Take 1 tablet (500 mg total) by mouth 2 (two) times daily with a meal. Patient taking differently: Take 500 mg by mouth 2 (two) times daily as needed.  06/27/15   Boykin Nearing, MD  Prenatal Vit-Fe Fumarate-FA (PRENATAL MULTIVITAMIN) TABS tablet Take 1 tablet by mouth daily at 12 noon.    Historical Provider, MD    Allergies  Allergen Reactions  . Sulfa Antibiotics Swelling    No family history on file.  Social History Social History  Substance Use Topics  . Smoking status: Never Smoker  . Smokeless tobacco: Never Used  . Alcohol use Yes     Comment: social alcohol use    Review of Systems Constitutional: Negative for fever. Cardiovascular: Negative for chest pain. Respiratory: Negative for shortness of breath. Gastrointestinal: Mild lower abdominal cramping. Genitourinary: Negative for dysuria. Negative for discharge or bleeding. Musculoskeletal: Mild lower back  pain/cramping 10-point ROS otherwise negative.  ____________________________________________   PHYSICAL EXAM:  VITAL SIGNS: ED Triage Vitals  Enc Vitals Group     BP 11/17/16 1949 139/86     Pulse Rate 11/17/16 1949 (!) 114     Resp 11/17/16 1949 18     Temp 11/17/16 1949 99.7 F (37.6 C)     Temp Source 11/17/16 1949 Oral     SpO2 11/17/16 1949 100 %     Weight 11/17/16 1948 235 lb (106.6 kg)     Height 11/17/16 1948 5\' 1"  (1.549 m)     Head Circumference --      Peak Flow --      Pain Score 11/17/16 1948 1     Pain Loc --      Pain Edu? --      Excl. in [place]? --     Constitutional: Alert and oriented. Well appearing and in no distress. Eyes: Normal exam ENT   Head: Normocephalic and atraumatic.   Mouth/Throat: Mucous membranes are moist. Cardiovascular: Normal rate, regular rhythm. No murmur Respiratory: Normal respiratory effort without tachypnea nor retractions. Breath sounds are clear Gastrointestinal: Soft and nontender. No distention.   Musculoskeletal: Nontender with normal range of motion in all extremities.  Neurologic:  Normal speech and language. No gross focal neurologic deficits  Skin:  Skin is warm, dry and intact.  Psychiatric: Mood and affect are normal.   ____________________________________________   RADIOLOGY  [place] pending  ____________________________________________   INITIAL IMPRESSION / ASSESSMENT AND PLAN / ED COURSE  Pertinent labs & imaging results that were available during my care of the patient were reviewed by me and considered in my medical decision making (see chart for details).  Patient presents the emergency department with some lower abdominal cramping over the past several weeks and with lower back cramping today. Denies vaginal discharge or bleeding. Patient has a history of an incompetent cervix and was closely followed by OB for a high risk pregnancy. Patient has an appointment with OB tomorrow but they told her if the  pain worsened to come in tonight for an ultrasound. We'll obtain an ultrasound and likely discussed with OB/GYN. Currently the patient appears well, with a nontender exam.  Ultrasound pending. Patient care signed out to oncoming physician.  ____________________________________________   FINAL CLINICAL IMPRESSION(S) / ED DIAGNOSES  Abdominal cramping    Harvest Dark, MD 11/17/16 2247

## 2016-11-17 NOTE — ED Notes (Signed)
Pt refusing hCG blood testing. Ultrasound made aware.

## 2016-11-17 NOTE — ED Triage Notes (Addendum)
Patient ambulatory to triage with steady gait, without difficulty or distress noted; pt reports cerclage sched for incomp cervix; c/o right sided abd/back cramping; denies bleeding; pt 13wks 5 days pregnant; G2,0 living; st was told by Dr Leafy Ro to come thru ED and go upstairs to have her cervix checked via transvag u/s; spoke with Dr Joni Fears and no further orders obtained at this time; called & clarified with L&D that pt is to be eval in ED

## 2016-11-18 NOTE — ED Provider Notes (Signed)
I assumed care of the patient from Kayla Shelton 11:00 PM with ultrasound pending which revealed a single live intrauterine pregnancy at 13 weeks 1 day. Cervix 3.53 cm. Patient states pain consistent with previous episodes of sciatica. However she was concerned secondary to right groin pain. Patient has an appointment scheduled this morning with Dr. Nicki Reaper 1. Patient is advised to keep this appointment   Gregor Hams, MD 11/18/16 [phone]

## 2016-11-18 NOTE — H&P (Signed)
Kayla Shelton is a 31 y.o. female here for .13+6 weeks  Prior fetal loss at 21 weeks with PPROM and subsequent delivery  Diagnosed with an incompetent  cx  Serial cervical lengths done   Past Medical History:  has a past medical history of Anemia, unspecified; Anxiety, unspecified; Depression, unspecified; Dysmenorrhea; Dyspareunia; and HSV-1 (herpes simplex virus 1) infection.  Past Surgical History:  has a past surgical history that includes Tonsillectomy & Adenoidectomy; Dilation and curettage, diagnostic / therapeutic (06/26/2015); Dilation and curettage of uterus (06/2015); and Hysteroscopic resection of endometrial polyps with Myosure (09/2015). Family History: family history includes COPD in her paternal grandmother; Dementia in her maternal grandmother; Diabetes in her father; Heart disease in her maternal grandfather and paternal grandfather; High blood pressure (Hypertension) in her father and maternal grandmother; Hyperlipidemia (Elevated cholesterol) in her brother, father, and paternal grandmother. Social History:  reports that she has never smoked. She has never used smokeless tobacco. She reports that she drinks alcohol. She reports that she does not use drugs. OB/GYN History:          OB History    Gravida Para Term Preterm AB Living   2 1 0 1 0 0   SAB TAB Ectopic Molar Multiple Live Births   0 0 0   0        Allergies: is allergic to sulfa (sulfonamide antibiotics). Medications:  Current Outpatient Prescriptions:  .  acetaminophen (TYLENOL) 325 MG tablet, Take 650 mg by mouth every 4 (four) hours as needed for Pain. Reported on 09/26/2015 , Disp: , Rfl:  .  calcium carbonate (TUMS) 200 mg calcium (500 mg) chewable tablet, Take by mouth as needed. Reported on 09/26/2015 , Disp: , Rfl:  .  PNV62/FA/OM3/DHA/EPA/FISH OIL (PRENATAL GUMMY ORAL), Take by mouth., Disp: , Rfl:   Review of Systems: General:                      No fatigue or weight loss Eyes:                            No vision changes Ears:                            No hearing difficulty Respiratory:                No cough or shortness of breath Pulmonary:                  No asthma or shortness of breath Cardiovascular:           No chest pain, palpitations, dyspnea on exertion Gastrointestinal:          No abdominal bloating, chronic diarrhea, constipations, masses, pain or hematochezia Genitourinary:             No hematuria, dysuria, abnormal vaginal discharge, pelvic pain, Menometrorrhagia Lymphatic:                   No swollen lymph nodes Musculoskeletal:         No muscle weakness Neurologic:                  No extremity weakness, syncope, seizure disorder Psychiatric:                  No history of depression, delusions or suicidal/homicidal ideation    Exam:  Vitals:   11/18/16 1622  BP: 112/78    Body mass index is 44.59 kg/m.  WDWN white/female in NAD   Abdomen: soft , no mass, normal active bowel sounds,  non-tender, no rebound tenderness   Impression:   The primary encounter diagnosis was Incompetent cervix. A diagnosis of Pregnant state, incidental was also pertinent to this visit.    Plan:  Mcdonald 5 mm band cerclage placement  Pr is aware of the possible risk including Rupture of membranes and fetal loss , heavy bleeding requiring a blood transfusion . Infection also discussed

## 2016-11-18 NOTE — H&P (Signed)
H&P Date of Service: 11/18/2016 4:55 PM Boykin Nearing, MD  Obstetrics    [] Hide copied text [name] a 31 y.o.femaleEDC 05/19/17  Prior fetal loss at 21 weeks with PPROM and subsequent delivery  Diagnosed with an incompetent  cx  Serial cervical lengths done , last 3.5 cm   Past Medical History:has a past medical history of Anemia, unspecified; Anxiety, unspecified; Depression, unspecified; Dysmenorrhea; Dyspareunia; and HSV-1 (herpes simplex virus 1) infection. Past Surgical History:has a past surgical history that includes Tonsillectomy &Adenoidectomy; Dilation and curettage, diagnostic / therapeutic (06/26/2015); Dilation and curettage of uterus (06/2015); and Hysteroscopic resection of endometrial polyps with Myosure (09/2015). Family History:family history includes COPD in her paternal grandmother; Dementia in her maternal grandmother; Diabetes in her father; Heart disease in her maternal grandfather and paternal grandfather; High blood pressure (Hypertension) in her father and maternal grandmother; Hyperlipidemia (Elevated cholesterol) in her brother, father, and paternal grandmother. Social History:reports that she has never smoked. She has never used smokeless tobacco. She reports that she drinks alcohol. She reports that she does not use drugs. OB/GYN History:         OB History   Gravida Para Term Preterm AB Living   2 1 0 1 0 0   SAB TAB Ectopic Molar Multiple Live Births   0 0 0  0       Allergies:is allergic to sulfa (sulfonamide antibiotics). Medications:  Current Outpatient Prescriptions:  . acetaminophen (TYLENOL) 325 MG tablet, Take 650 mg by mouth every 4 (four) hours as needed for Pain. Reported on 09/26/2015 , Disp: , Rfl:  . calcium carbonate (TUMS) 200 mg calcium (500 mg) chewable tablet, Take by mouth as needed. Reported on 09/26/2015 , Disp: , Rfl:  . PNV62/FA/OM3/DHA/EPA/FISH OIL (PRENATAL GUMMY ORAL), Take by mouth.,  Disp: , Rfl:   Review of Systems: General: No fatigue or weightloss Eyes:No vision changes Ears:No hearing difficulty Respiratory:No cough or shortness of breath Pulmonary: No asthma or shortness of breath Cardiovascular:No chest pain, palpitations, dyspnea on exertion Gastrointestinal:No abdominal bloating, chronic diarrhea, constipations, masses, pain or hematochezia Genitourinary:No hematuria, dysuria, abnormal vaginal discharge, pelvic pain, Menometrorrhagia Lymphatic:No swollen lymph nodes Musculoskeletal:No muscle weakness Neurologic:No extremity weakness, syncope, seizure disorder Psychiatric:No history of depression, delusions or suicidal/homicidal ideation   Exam:      Vitals:   11/18/16 1622  BP: 112/78    Body mass index is 44.59 kg/m.  WDWN white/female in NAD  Lungs CTA  CV RRR  Abdomen: soft , no mass, normal active bowel sounds, non-tender, no rebound tenderness Pelvic : cervix closed   Impression:   The primary encounter diagnosis was Incompetent cervix.    Plan:  Mcdonald 5 mm band cerclage placement on 12/04/16 Pr is aware of the possible risk including Rupture of membranes and fetal loss , heavy bleeding requiring a blood transfusion . Infection also discussed         Electronically signed by Boykin Nearing, MD at 11/18/2016 4:59 PM      Pre-admit on 12/04/2016        Routing History

## 2016-11-26 ENCOUNTER — Other Ambulatory Visit: Payer: BLUE CROSS/BLUE SHIELD

## 2016-11-30 ENCOUNTER — Encounter: Payer: Self-pay | Admitting: *Deleted

## 2016-11-30 ENCOUNTER — Encounter
Admission: RE | Admit: 2016-11-30 | Discharge: 2016-11-30 | Disposition: A | Discharge status: Home / Self Care | Payer: Managed Care, Other (non HMO) | Source: Ambulatory Visit | Attending: Obstetrics and Gynecology | Admitting: Obstetrics and Gynecology

## 2016-11-30 DIAGNOSIS — Z01812 Encounter for preprocedural laboratory examination: Secondary | ICD-10-CM | POA: Insufficient documentation

## 2016-11-30 HISTORY — DX: Headache: R51

## 2016-11-30 HISTORY — DX: Headache, unspecified: R51.9

## 2016-11-30 LAB — CBC
HEMATOCRIT: 35 % (ref 35.0–47.0)
Hemoglobin: 12.2 g/dL (ref 12.0–16.0)
MCH: 29.9 pg (ref 26.0–34.0)
MCHC: 35 g/dL (ref 32.0–36.0)
MCV: 85.6 fL (ref 80.0–100.0)
PLATELETS: 356 10*3/uL (ref 150–440)
RBC: 4.09 MIL/uL (ref 3.80–5.20)
RDW: 13.4 % (ref 11.5–14.5)
WBC: 10.9 10*3/uL (ref 3.6–11.0)

## 2016-11-30 LAB — TYPE AND SCREEN
ABO/RH(D): A POS
Antibody Screen: NEGATIVE
EXTEND SAMPLE REASON: UNDETERMINED

## 2016-11-30 LAB — BASIC METABOLIC PANEL
Anion gap: 4 — ABNORMAL LOW (ref 5–15)
BUN: 8 mg/dL (ref 6–20)
CHLORIDE: 107 mmol/L (ref 101–111)
CO2: 25 mmol/L (ref 22–32)
CREATININE: 0.49 mg/dL (ref 0.44–1.00)
Calcium: 9 mg/dL (ref 8.9–10.3)
GFR calc Af Amer: >60 mL/min (ref >60)
GLUCOSE: 106 mg/dL — AB (ref 65–99)
Potassium: 4 mmol/L (ref 3.5–5.1)
SODIUM: 136 mmol/L (ref 135–145)

## 2016-11-30 NOTE — Patient Instructions (Addendum)
Your procedure is scheduled on: December 04, 2016 (Friday) Report to Same Day Surgery 2nd floor medical mall [facility] Entrance-take elevator on left to 2nd floor.  Check in with surgery information desk.) To find out your arrival time please call [phone] between 1PM - 3PM on December 03, 2016 (Thursday)  Remember: Instructions that are not followed completely may result in serious medical risk, up to and including death, or upon the discretion of your surgeon and anesthesiologist your surgery may need to be rescheduled.    _x___ 1. Do not eat food or drink liquids after midnight. No gum chewing or  hard candies.                              __x__ 2. No Alcohol for 24 hours before or after surgery.   __x__3. No Smoking for 24 prior to surgery.   ____  4. Bring all medications with you on the day of surgery if instructed.    __x__ 5. Notify your doctor if there is any change in your medical condition     (cold, fever, infections).     Do not wear jewelry, make-up, hairpins, clips or nail polish.  Do not wear lotions, powders, or perfumes. You may wear deodorant.  Do not shave 48 hours prior to surgery. Men may shave face and neck.  Do not bring valuables to the hospital.    [facility] is not responsible for any belongings or valuables.               Contacts, dentures or bridgework may not be worn into surgery.  Leave your suitcase in the car. After surgery it may be brought to your room.  For patients admitted to the hospital, discharge time is determined by your                       treatment team.   Patients discharged the day of surgery will not be allowed to drive home.  You will need someone to drive you home and stay with you the night of your procedure.    Please read over the following fact sheets that you were given:   [facility] Preparing for Surgery and or MRSA Information   ___ Take anti-hypertensive (unless it includes a diuretic), cardiac, seizure, asthma,      anti-reflux and psychiatric medicines. These include:  1.   2.  3.  4.  5.  6.  __x__Fleets enema or Magnesium Citrate as directed. (CHECK WITH DR Ouida Sills OFFICE CONCERNING FLEET ENEMA)  _x___ Use CHG Soap or sage wipes as directed on instruction sheet   ____ Use inhalers on the day of surgery and bring to hospital day of surgery  ____ Stop Metformin and Janumet 2 days prior to surgery.    ____ Take 1/2 of usual insulin dose the night before surgery and none on the morning surgery   .   _x___ Follow recommendations from Cardiologist, Pulmonologist or PCP regarding          stopping Aspirin, Coumadin, Pllavix ,Eliquis, Effient, or Pradaxa, and Pletal.  X____Stop Anti-inflammatories such as Advil, Aleve, Ibuprofen, Motrin, Naproxen, Naprosyn, Goodies powders or aspirin products. OK to take Tylenol .   _x___ Stop supplements until after surgery.  But may continue Vitamin D, Vitamin B, and multivitamin.      .   ____ Bring C-Pap to the hospital.

## 2016-12-04 ENCOUNTER — Ambulatory Visit: Payer: Managed Care, Other (non HMO) | Admitting: Certified Registered"

## 2016-12-04 ENCOUNTER — Ambulatory Visit
Admission: RE | Admit: 2016-12-04 | Discharge: 2016-12-04 | Disposition: A | Discharge status: Home / Self Care | Payer: Managed Care, Other (non HMO) | Source: Ambulatory Visit | Attending: Obstetrics and Gynecology | Admitting: Obstetrics and Gynecology

## 2016-12-04 ENCOUNTER — Encounter
Admission: RE | Disposition: A | Discharge status: Home / Self Care | Payer: Self-pay | Source: Ambulatory Visit | Attending: Obstetrics and Gynecology

## 2016-12-04 ENCOUNTER — Encounter: Payer: Self-pay | Admitting: *Deleted

## 2016-12-04 DIAGNOSIS — K219 Gastro-esophageal reflux disease without esophagitis: Secondary | ICD-10-CM | POA: Insufficient documentation

## 2016-12-04 DIAGNOSIS — F419 Anxiety disorder, unspecified: Secondary | ICD-10-CM | POA: Diagnosis not present

## 2016-12-04 DIAGNOSIS — F329 Major depressive disorder, single episode, unspecified: Secondary | ICD-10-CM | POA: Diagnosis not present

## 2016-12-04 DIAGNOSIS — D649 Anemia, unspecified: Secondary | ICD-10-CM | POA: Diagnosis not present

## 2016-12-04 DIAGNOSIS — O3431 Maternal care for cervical incompetence, first trimester: Secondary | ICD-10-CM | POA: Insufficient documentation

## 2016-12-04 DIAGNOSIS — Z79899 Other long term (current) drug therapy: Secondary | ICD-10-CM | POA: Insufficient documentation

## 2016-12-04 DIAGNOSIS — N946 Dysmenorrhea, unspecified: Secondary | ICD-10-CM | POA: Diagnosis not present

## 2016-12-04 DIAGNOSIS — N289 Disorder of kidney and ureter, unspecified: Secondary | ICD-10-CM | POA: Insufficient documentation

## 2016-12-04 HISTORY — PX: CERVICAL CERCLAGE: SHX1329

## 2016-12-04 LAB — TYPE AND SCREEN
ABO/RH(D): A POS
Antibody Screen: NEGATIVE

## 2016-12-04 SURGERY — CERCLAGE, CERVIX, VAGINAL APPROACH
Anesthesia: General | Wound class: Clean Contaminated

## 2016-12-04 MED ORDER — FENTANYL CITRATE (PF) 100 MCG/2ML IJ SOLN: 25.0000 ug | INTRAMUSCULAR | Status: DC | PRN

## 2016-12-04 MED ORDER — FENTANYL CITRATE (PF) 100 MCG/2ML IJ SOLN
INTRAMUSCULAR | Status: AC
  Filled 2016-12-04: qty 2

## 2016-12-04 MED ORDER — PROPOFOL 500 MG/50ML IV EMUL
INTRAVENOUS | Status: AC
  Filled 2016-12-04: qty 50

## 2016-12-04 MED ORDER — ACETAMINOPHEN NICU IV SYRINGE 10 MG/ML
INTRAVENOUS | Status: AC
  Filled 2016-12-04: qty 1

## 2016-12-04 MED ORDER — ESMOLOL HCL 100 MG/10ML IV SOLN
INTRAVENOUS | Status: AC
  Filled 2016-12-04: qty 10

## 2016-12-04 MED ORDER — LACTATED RINGERS IV SOLN
INTRAVENOUS | Status: DC
  Administered 2016-12-04: 07:00:00 via INTRAVENOUS

## 2016-12-04 MED ORDER — ONDANSETRON HCL 4 MG/2ML IJ SOLN
INTRAMUSCULAR | Status: DC | PRN
  Administered 2016-12-04: 4 mg via INTRAVENOUS

## 2016-12-04 MED ORDER — ONDANSETRON HCL 4 MG/2ML IJ SOLN: 4.0000 mg | Freq: Once | INTRAMUSCULAR | Status: DC | PRN

## 2016-12-04 MED ORDER — MIDAZOLAM HCL 2 MG/2ML IJ SOLN
INTRAMUSCULAR | Status: AC
  Filled 2016-12-04: qty 2

## 2016-12-04 MED ORDER — FLEET ENEMA 7-19 GM/118ML RE ENEM: 1.0000 | ENEMA | Freq: Once | RECTAL | Status: DC

## 2016-12-04 MED ORDER — LACTATED RINGERS IV SOLN
INTRAVENOUS | Status: DC
  Administered 2016-12-04: 08:00:00 via INTRAVENOUS

## 2016-12-04 MED ORDER — DIPHENHYDRAMINE HCL 50 MG/ML IJ SOLN
INTRAMUSCULAR | Status: DC | PRN
  Administered 2016-12-04: 6.25 mg via INTRAVENOUS

## 2016-12-04 MED ORDER — FENTANYL CITRATE (PF) 100 MCG/2ML IJ SOLN
INTRAMUSCULAR | Status: DC | PRN
  Administered 2016-12-04 (×2): 50 ug via INTRAVENOUS

## 2016-12-04 MED ORDER — CEFOXITIN SODIUM-DEXTROSE 2-2.2 GM-% IV SOLR (PREMIX)
2.0000 g | INTRAVENOUS | Status: AC
  Administered 2016-12-04: 2000 mg via INTRAVENOUS

## 2016-12-04 MED ORDER — DEXAMETHASONE SODIUM PHOSPHATE 10 MG/ML IJ SOLN
INTRAMUSCULAR | Status: DC | PRN
  Administered 2016-12-04: 5 mg via INTRAVENOUS

## 2016-12-04 MED ORDER — CEFOXITIN SODIUM-DEXTROSE 2-2.2 GM-% IV SOLR (PREMIX)
INTRAVENOUS | Status: AC
  Administered 2016-12-04: 2000 mg via INTRAVENOUS
  Filled 2016-12-04: qty 50

## 2016-12-04 MED ORDER — ACETAMINOPHEN 10 MG/ML IV SOLN
INTRAVENOUS | Status: DC | PRN
Start: 2016-12-04 — End: 2016-12-04
  Administered 2016-12-04: 1000 mg via INTRAVENOUS

## 2016-12-04 MED ORDER — SUCCINYLCHOLINE CHLORIDE 20 MG/ML IJ SOLN
INTRAMUSCULAR | Status: DC | PRN
  Administered 2016-12-04: 100 mg via INTRAVENOUS

## 2016-12-04 MED ORDER — PROPOFOL 10 MG/ML IV BOLUS
INTRAVENOUS | Status: AC
  Filled 2016-12-04: qty 20

## 2016-12-04 MED ORDER — PROPOFOL 10 MG/ML IV BOLUS
INTRAVENOUS | Status: DC | PRN
  Administered 2016-12-04: 50 mg via INTRAVENOUS
  Administered 2016-12-04: 200 mg via INTRAVENOUS

## 2016-12-04 MED ORDER — CEFOXITIN SODIUM-DEXTROSE 2-2.2 GM-% IV SOLR (PREMIX): 2.0000 g | INTRAVENOUS | Status: DC

## 2016-12-04 SURGICAL SUPPLY — 18 items
CANISTER SUCT 1200ML W/VALVE (MISCELLANEOUS) ×3 IMPLANT
CATH ROBINSON RED A/P 16FR (CATHETERS) ×3 IMPLANT
DRAPE UNDER BUTTOCK W/FLU (DRAPES) ×3 IMPLANT
ELECT REM PT RETURN 9FT ADLT (ELECTROSURGICAL) ×3
ELECTRODE REM PT RTRN 9FT ADLT (ELECTROSURGICAL) ×1 IMPLANT
GLOVE BIO SURGEON STRL SZ8 (GLOVE) ×3 IMPLANT
GOWN STRL REUS W/ TWL LRG LVL3 (GOWN DISPOSABLE) ×1 IMPLANT
GOWN STRL REUS W/ TWL XL LVL3 (GOWN DISPOSABLE) ×1 IMPLANT
GOWN STRL REUS W/TWL LRG LVL3 (GOWN DISPOSABLE) ×2
GOWN STRL REUS W/TWL XL LVL3 (GOWN DISPOSABLE) ×2
KIT RM TURNOVER CYSTO AR (KITS) ×3 IMPLANT
LABEL OR SOLS (LABEL) ×3 IMPLANT
NS IRRIG 500ML POUR BTL (IV SOLUTION) ×3 IMPLANT
PACK BASIN MINOR ARMC (MISCELLANEOUS) ×3 IMPLANT
PAD OB MATERNITY 4.3X12.25 (PERSONAL CARE ITEMS) ×3 IMPLANT
PAD PREP 24X41 OB/GYN DISP (PERSONAL CARE ITEMS) ×3 IMPLANT
SURGILUBE 2OZ TUBE FLIPTOP (MISCELLANEOUS) ×3 IMPLANT
TAPE MERSILENE 5MM 36 OS-8 WHT (SUTURE) ×3 IMPLANT

## 2016-12-04 NOTE — Progress Notes (Signed)
Fetal heart tones preop =150s Post op in pacu =139

## 2016-12-04 NOTE — Discharge Instructions (Signed)

## 2016-12-04 NOTE — OR Nursing (Signed)
RNx 2 from labor and delivery to obtain fetal heart tones; per L&D rn heart rate is within the 150's.  CRNA present at bedside for [facility]

## 2016-12-04 NOTE — Anesthesia Postprocedure Evaluation (Signed)
Anesthesia Post Note  Patient: Kayla Shelton  Procedure(s) Performed: Procedure(s) (LRB): CERCLAGE CERVICAL (N/A)  Patient location during evaluation: PACU Anesthesia Type: General Level of consciousness: awake and alert Pain management: pain level controlled Vital Signs Assessment: post-procedure vital signs reviewed and stable Respiratory status: spontaneous breathing and respiratory function stable Cardiovascular status: stable Anesthetic complications: no     Last Vitals:  Vitals:   12/04/16 0832 12/04/16 0841  BP: (!) 119/51 (!) 120/97  Pulse: (!) 116 85  Resp: (!) 22 18  Temp: 36.7 C     Last Pain:  Vitals:   12/04/16 0841  TempSrc:   PainSc: 0-No pain                 KEPHART,WILLIAM K

## 2016-12-04 NOTE — Anesthesia Post-op Follow-up Note (Cosign Needed)
Anesthesia QCDR form completed.        

## 2016-12-04 NOTE — Brief Op Note (Signed)
12/04/2016  8:18 AM  PATIENT:  Kayla Shelton  31 y.o. female  PRE-OPERATIVE DIAGNOSIS:  Incompetent cervix  POST-OPERATIVE DIAGNOSIS:  Incompetent cervix  PROCEDURE:  Procedure(s): CERCLAGE CERVICAL (N/A) mcdonald 5 mm mersilene band SURGEON:  Surgeon(s) and Role:    Boykin Nearing, MD - Primary  PHYSICIAN ASSISTANTLowella Grip , MD  ASSISTANTS: none   ANESTHESIA:   general  EBL:  10 cc IOF500 cc uo 50 cc BLOOD ADMINISTERED:none  DRAINS: none   LOCAL MEDICATIONS USED:  NONE  SPECIMEN:  No Specimen  DISPOSITION OF SPECIMEN:  N/A  COUNTS:  YES  TOURNIQUET:  * No tourniquets in log *  DICTATION: .Other Dictation: Dictation Number verbal   PLAN OF CARE: Discharge to home after PACU  PATIENT DISPOSITION:  PACU - hemodynamically stable.   Delay start of Pharmacological VTE agent (>24hrs) due to surgical blood loss or risk of bleeding: not applicable

## 2016-12-04 NOTE — Anesthesia Preprocedure Evaluation (Signed)
Anesthesia Evaluation  Patient identified by MRN, date of birth, ID band Patient awake    Reviewed: Allergy & Precautions, NPO status , Patient's Chart, lab work & pertinent test results  History of Anesthesia Complications Negative for: history of anesthetic complications  Airway Mallampati: III       Dental   Pulmonary neg pulmonary ROS,           Cardiovascular negative cardio ROS       Neuro/Psych Anxiety Depression    GI/Hepatic Neg liver ROS, GERD  Medicated and Poorly Controlled,  Endo/Other    Renal/GU Renal disease (stones)     Musculoskeletal   Abdominal   Peds  Hematology  (+) anemia ,   Anesthesia Other Findings   Reproductive/Obstetrics (+) Pregnancy                             Anesthesia Physical Anesthesia Plan  ASA: II  Anesthesia Plan: General   Post-op Pain Management:    Induction: Intravenous  Airway Management Planned: Oral ETT  Additional Equipment:   Intra-op Plan:   Post-operative Plan:   Informed Consent: I have reviewed the patients History and Physical, chart, labs and discussed the procedure including the risks, benefits and alternatives for the proposed anesthesia with the patient or authorized representative who has indicated his/her understanding and acceptance.     Plan Discussed with:   Anesthesia Plan Comments:         Anesthesia Quick Evaluation

## 2016-12-04 NOTE — Anesthesia Procedure Notes (Signed)
Date/Time: 12/04/2016 8:33 AM Performed by: Timoteo Expose Pre-anesthesia Checklist: Patient identified, Emergency Drugs available, Suction available and Patient being monitored Patient Re-evaluated:Patient Re-evaluated prior to inductionOxygen Delivery Method: Circle system utilized Preoxygenation: Pre-oxygenation with 100% oxygen Intubation Type: IV induction Ventilation: Mask ventilation without difficulty

## 2016-12-04 NOTE — Transfer of Care (Signed)
Immediate Anesthesia Transfer of Care Note  Patient: Kayla Shelton  Procedure(s) Performed: Procedure(s): CERCLAGE CERVICAL (N/A)  Patient Location: PACU  Anesthesia Type:General  Level of Consciousness: awake  Airway & Oxygen Therapy: Patient Spontanous Breathing  Post-op Assessment: Report given to RN  Post vital signs: Reviewed  Last Vitals:  Vitals:   12/04/16 0612 12/04/16 0827  BP: (!) 150/78 (!) 119/51  Pulse: 98 (!) 108  Resp: 18 12  Temp: (!) 35.6 C 36.7 C    Last Pain:  Vitals:   12/04/16 0612  TempSrc: Tympanic         Complications: No apparent anesthesia complications

## 2016-12-04 NOTE — Progress Notes (Signed)
Up to bathroom to void   Minimal drainage on peri pad

## 2016-12-04 NOTE — Progress Notes (Signed)
Ready for cervical cerclage . NPO . All questions answered . Ready to proceed

## 2016-12-04 NOTE — Op Note (Signed)
NAME:  Kayla Shelton, Kayla Shelton                      ACCOUNT NO.:  MEDICAL RECORD NO.:  [id number]  LOCATION:                                 FACILITY:  PHYSICIAN:  Laverta Baltimore, MD     DATE OF BIRTH:  DATE OF PROCEDURE: DATE OF DISCHARGE:                              OPERATIVE REPORT   PREOPERATIVE DIAGNOSIS: 1. Incompetent cervix. 2. 16 plus 1 weeks estimated gestational age.  POSTOPERATIVE DIAGNOSIS: 1. Incompetent cervix. 2. 16 plus 1 weeks estimated gestational age.  PROCEDURE PERFORMED:  McDonald cervical cerclage.  SURGEON:  Laverta Baltimore, MD  ANESTHESIA:  General endotracheal anesthesia.  SURGEON:  Laverta Baltimore, MD.  FIRST ASSISTANT:  Benjaman Kindler, MD.  INDICATION:  A 31 year old, gravida 2, para 1 patient with a history of preterm premature rupture of membranes at 21 weeks with ultimate fetal loss with last pregnancy.  The patient was felt to have passive cervical dilation up to that point.  DESCRIPTION OF PROCEDURE:  After adequate general endotracheal anesthesia, the patient was placed in dorsal supine position, legs in the candy-cane stirrups.  The patient did receive 2 g IV cefoxitin prior to commencement of the case.  Fetal heart tones preoperatively 150s. Time-out was performed.  Straight catheterization of the bladder was performed yielding 50 mL clear urine.  A weighted speculum was placed in the posterior vaginal vault.  Deaver retractor used to isolate the anterior compartment of vagina.  The cervix had been previously prepped with Betadine.  A 5 mm Mersilene band was used to place a cerclage starting at 12 o'clock position, exiting at 2 o'clock position; entering at the 4 o'clock position, exiting at the 6 o'clock position; entering at the 7 o'clock position, exiting at the 8 o'clock position; entering at the 10 o'clock position, and exiting at the 12 o'clock position. MERSILENE band was then cinched tight yet not overly tightened  and multiple knots were placed at the 11 o'clock position.  Good application of cerclage was noted.  No leakage of fluid.  There was about 10 mL of blood loss and no active bleeding at the end of the case.  The patient tolerated the procedure well.  INTRAOPERATIVE FLUIDS:  500 mL.  ESTIMATED BLOOD LOSS:  10 mL.  Fetal heart tones in the PACU 130, equal 139.  The patient did receive 1 g IV Tylenol prior to leaving the operating room.          ______________________________ Laverta Baltimore, MD     TS/MEDQ  D:  12/04/2016  T:  12/04/2016  Job:  [id number]

## 2016-12-05 ENCOUNTER — Encounter: Payer: Self-pay | Admitting: Obstetrics and Gynecology

## 2017-02-26 ENCOUNTER — Encounter: Payer: Self-pay | Admitting: *Deleted

## 2017-02-26 ENCOUNTER — Observation Stay
Admission: EM | Admit: 2017-02-26 | Discharge: 2017-02-26 | Disposition: A | Discharge status: Home / Self Care | Payer: 59 | Attending: Obstetrics and Gynecology | Admitting: Obstetrics and Gynecology

## 2017-02-26 DIAGNOSIS — Z3A26 26 weeks gestation of pregnancy: Secondary | ICD-10-CM | POA: Diagnosis not present

## 2017-02-26 DIAGNOSIS — O26892 Other specified pregnancy related conditions, second trimester: Secondary | ICD-10-CM | POA: Diagnosis not present

## 2017-02-26 DIAGNOSIS — W19XXXA Unspecified fall, initial encounter: Secondary | ICD-10-CM | POA: Insufficient documentation

## 2017-02-26 DIAGNOSIS — S3991XA Unspecified injury of abdomen, initial encounter: Secondary | ICD-10-CM | POA: Diagnosis present

## 2017-02-26 NOTE — Discharge Summary (Signed)
26 week fell in parking lot. No direct trauama to belly  Reassuring fetal monitoring  D/C home

## 2017-02-26 NOTE — OB Triage Note (Signed)
Recvd to LDR2 after falling in the parking lot of the Dr's office.  Changed to gown and to bed.  EFM applied.  Oriented to room.  Plan of care discussed and oriented to room.  Agrees with plan, verbalized understanding.

## 2017-02-26 NOTE — Progress Notes (Signed)
Discharge instructions given and explained.  Verbalized understanding.  Signed copy on chart.

## 2017-03-16 ENCOUNTER — Encounter: Payer: Self-pay | Admitting: *Deleted

## 2017-03-16 ENCOUNTER — Encounter: Payer: 59 | Attending: Obstetrics and Gynecology | Admitting: *Deleted

## 2017-03-16 VITALS — BP 100/72 | Ht 61.0 in | Wt 253.3 lb

## 2017-03-16 DIAGNOSIS — Z713 Dietary counseling and surveillance: Secondary | ICD-10-CM | POA: Insufficient documentation

## 2017-03-16 DIAGNOSIS — O24410 Gestational diabetes mellitus in pregnancy, diet controlled: Secondary | ICD-10-CM | POA: Diagnosis not present

## 2017-03-16 NOTE — Patient Instructions (Signed)
Read booklet on Gestational Diabetes Follow Gestational Meal Planning Guidelines Complete a 3 Day Food Record and bring to next appointment Check blood sugars 4 x day - before breakfast and 2 hrs after every meal and record  Bring blood sugar log to all appointments Call MD for prescription for meter strips and lancets Strips  One Touch Verio Lancets   One Touch Delica Purchase urine ketone strips if blood sugars not controlled and check urine ketones every am:  If + increase bedtime snack to 1 protein and 2 carbohydrate servings

## 2017-03-16 NOTE — Progress Notes (Signed)
Diabetes Self-Management Education  Visit Type: First/Initial  Appt. Start Time: 1505 Appt. End Time: 8413  03/16/2017  Kayla Shelton, identified by name and date of birth, is a 31 y.o. female with a diagnosis of Diabetes: Gestational Diabetes.   ASSESSMENT  Blood pressure 100/72, height 5\' 1"  (1.549 m), weight 253 lb 4.8 oz (114.9 kg), last menstrual period 08/15/2016. Body mass index is 47.86 kg/m.      Diabetes Self-Management Education - 03/16/17 1652      Visit Information   Visit Type First/Initial     Initial Visit   Diabetes Type Gestational Diabetes   Are you currently following a meal plan? Yes   What type of meal plan do you follow? "low carb, low sugar"   Are you taking your medications as prescribed? Yes   Date Diagnosed 1 week ago     Health Coping   How would you rate your overall health? Fair     Psychosocial Assessment   Patient Belief/Attitude about Diabetes Afraid  "stressed, anxious"   Self-care barriers None   Self-management support Doctor's office;Family   Other persons present Spouse/SO   Patient Concerns Nutrition/Meal planning;Glycemic Control   Special Needs None   Preferred Learning Style Auditory   Learning Readiness Change in progress   How often do you need to have someone help you when you read instructions, pamphlets, or other written materials from your doctor or pharmacy? 1 - Never   What is the last grade level you completed in school? Associate degree     Pre-Education Assessment   Patient understands the diabetes disease and treatment process. Needs Instruction   Patient understands incorporating nutritional management into lifestyle. Needs Instruction   Patient undertands incorporating physical activity into lifestyle. Needs Instruction   Patient understands using medications safely. Needs Instruction   Patient understands monitoring blood glucose, interpreting and using results Needs Review   Patient understands prevention,  detection, and treatment of acute complications. Needs Instruction   Patient understands prevention, detection, and treatment of chronic complications. Needs Review   Patient understands how to develop strategies to address psychosocial issues. Needs Instruction   Patient understands how to develop strategies to promote health/change behavior. Needs Instruction     Complications   How often do you check your blood sugar? 3-4 times/day   Fasting Blood glucose range (mg/dL) 70-129  FBG's 79-100   Postprandial Blood glucose range (mg/dL) 70-129;130-179  pp's 80-138 mg/dL   Have you had a dilated eye exam in the past 12 months? No   Have you had a dental exam in the past 12 months? Yes   Are you checking your feet? Yes   How many days per week are you checking your feet? 7     Dietary Intake   Breakfast 2 boiled eggs; bacon egg and cheese biscuit   Snack (morning) fruit   Lunch hamburger patty with cheese, zucchini, Greek yogurt   Snack (afternoon) fruit   Dinner meat, [place] bacon, boiled eggs, hot dog, cheese, bread, potatoes, corn, peas, beans   Beverage(s) water, occasional 1/2 and 1/2 tea     Exercise   Exercise Type ADL's  Pt has problems with her cervix and is not able to exercise.      Patient Education   Previous Diabetes Education No   Disease state  Definition of diabetes, type 1 and 2, and the diagnosis of diabetes   Nutrition management  Role of diet in the treatment of diabetes and the  relationship between the three main macronutrients and blood glucose level;Carbohydrate counting;Reviewed blood glucose goals for pre and post meals and how to evaluate the patients' food intake on their blood glucose level.   Physical activity and exercise  Role of exercise on diabetes management, blood pressure control and cardiac health.   Monitoring Taught/evaluated SMBG meter.;Purpose and frequency of SMBG.;Taught/discussed recording of test results and interpretation of SMBG.;Ketone  testing, when, how.   Chronic complications Relationship between chronic complications and blood glucose control   Psychosocial adjustment Role of stress on diabetes;Identified and addressed patients feelings and concerns about diabetes   Preconception care Pregnancy and GDM  Role of pre-pregnancy blood glucose control on the development of the fetus;Reviewed with patient blood glucose goals with pregnancy;Role of family planning for patients with diabetes     Individualized Goals (developed by patient)   Reducing Risk Improve blood sugars     Outcomes   Expected Outcomes Demonstrated interest in learning. Expect positive outcomes      Individualized Plan for Diabetes Self-Management Training:   Learning Objective:  Patient will have a greater understanding of diabetes self-management. Patient education plan is to attend individual and/or group sessions per assessed needs and concerns.   Plan:   Patient Instructions  Read booklet on Gestational Diabetes Follow Gestational Meal Planning Guidelines Complete a 3 Day Food Record and bring to next appointment Check blood sugars 4 x day - before breakfast and 2 hrs after every meal and record  Bring blood sugar log to all appointments Call MD for prescription for meter strips and lancets Strips  One Touch Verio Lancets   One Touch Delica Purchase urine ketone strips if blood sugars not controlled and check urine ketones every am:  If + increase bedtime snack to 1 protein and 2 carbohydrate servings  Expected Outcomes:  Demonstrated interest in learning. Expect positive outcomes  Education material provided:  Gestational Booklet Gestational Meal Planning Guidelines Simple Meal Plan Viewed Gestational Diabetes Video Meter - One Touch Verio Flex 3 Day Food Record Goals for a Healthy Pregnancy  If problems or questions, patient to contact team via:  Johny Drilling, RN, [place], CDE [phone]  Future DSME appointment:  March 26, 2017 with the dietitian

## 2017-03-26 ENCOUNTER — Encounter: Payer: Self-pay | Admitting: Dietician

## 2017-03-26 ENCOUNTER — Encounter: Payer: 59 | Admitting: Dietician

## 2017-03-26 VITALS — BP 98/68 | Ht 61.0 in | Wt 252.1 lb

## 2017-03-26 DIAGNOSIS — Z713 Dietary counseling and surveillance: Secondary | ICD-10-CM | POA: Diagnosis not present

## 2017-03-26 DIAGNOSIS — O24410 Gestational diabetes mellitus in pregnancy, diet controlled: Secondary | ICD-10-CM

## 2017-03-26 NOTE — Progress Notes (Signed)
   Patient's BG record indicates BGs generally within goal ranges; one reading of 145 after high carb meal.   Patient's food diary indicates balanced meals, eating at regular intervals.    Provided 1700kcal meal plan, and wrote individualized menus based on patient's food preferences.  Instructed patient on food safety, including avoidance of Listeriosis, and limiting mercury from fish.  Discussed importance of maintaining healthy lifestyle habits to reduce risk of Type 2 DM as well as Gestational DM with any future pregnancies.  Advised patient to use any remaining testing supplies to test some BGs after delivery, and to have BG tested ideally annually, as well as prior to attempting future pregnancies.

## 2017-03-26 NOTE — Patient Instructions (Signed)
   Continue with your healthy food choices and portion control, great job!  Make sure to include at least 2 carb servings with each meal for adequate daily intake.

## 2017-03-29 ENCOUNTER — Inpatient Hospital Stay
Admission: EM | Admit: 2017-03-29 | Discharge: 2017-03-29 | Disposition: A | Discharge status: Home / Self Care | Payer: 59 | Source: Ambulatory Visit | Attending: Obstetrics and Gynecology | Admitting: Obstetrics and Gynecology

## 2017-03-29 DIAGNOSIS — O4700 False labor before 37 completed weeks of gestation, unspecified trimester: Secondary | ICD-10-CM

## 2017-03-29 DIAGNOSIS — E669 Obesity, unspecified: Secondary | ICD-10-CM | POA: Insufficient documentation

## 2017-03-29 DIAGNOSIS — O99613 Diseases of the digestive system complicating pregnancy, third trimester: Secondary | ICD-10-CM | POA: Diagnosis not present

## 2017-03-29 DIAGNOSIS — O99213 Obesity complicating pregnancy, third trimester: Secondary | ICD-10-CM | POA: Insufficient documentation

## 2017-03-29 DIAGNOSIS — F329 Major depressive disorder, single episode, unspecified: Secondary | ICD-10-CM | POA: Diagnosis not present

## 2017-03-29 DIAGNOSIS — Z87442 Personal history of urinary calculi: Secondary | ICD-10-CM | POA: Insufficient documentation

## 2017-03-29 DIAGNOSIS — Z3A32 32 weeks gestation of pregnancy: Secondary | ICD-10-CM | POA: Insufficient documentation

## 2017-03-29 DIAGNOSIS — O99343 Other mental disorders complicating pregnancy, third trimester: Secondary | ICD-10-CM | POA: Diagnosis not present

## 2017-03-29 DIAGNOSIS — Z9889 Other specified postprocedural states: Secondary | ICD-10-CM | POA: Diagnosis not present

## 2017-03-29 DIAGNOSIS — O163 Unspecified maternal hypertension, third trimester: Secondary | ICD-10-CM | POA: Diagnosis present

## 2017-03-29 DIAGNOSIS — K219 Gastro-esophageal reflux disease without esophagitis: Secondary | ICD-10-CM | POA: Diagnosis not present

## 2017-03-29 DIAGNOSIS — E119 Type 2 diabetes mellitus without complications: Secondary | ICD-10-CM | POA: Diagnosis not present

## 2017-03-29 DIAGNOSIS — F419 Anxiety disorder, unspecified: Secondary | ICD-10-CM | POA: Insufficient documentation

## 2017-03-29 LAB — PROTEIN / CREATININE RATIO, URINE
CREATININE, URINE: 99 mg/dL
PROTEIN CREATININE RATIO: 0.08 mg/mg{creat} (ref 0.00–0.15)
TOTAL PROTEIN, URINE: 8 mg/dL

## 2017-03-29 LAB — CBC WITH DIFFERENTIAL/PLATELET
Basophils Absolute: 0 10*3/uL (ref 0–0.1)
Basophils Relative: 0 %
Eosinophils Absolute: 0.1 10*3/uL (ref 0–0.7)
Eosinophils Relative: 1 %
HCT: 36.4 % (ref 35.0–47.0)
HEMOGLOBIN: 12.4 g/dL (ref 12.0–16.0)
LYMPHS ABS: 1.6 10*3/uL (ref 1.0–3.6)
LYMPHS PCT: 17 %
MCH: 30.3 pg (ref 26.0–34.0)
MCHC: 34.1 g/dL (ref 32.0–36.0)
MCV: 88.9 fL (ref 80.0–100.0)
MONOS PCT: 6 %
Monocytes Absolute: 0.5 10*3/uL (ref 0.2–0.9)
NEUTROS PCT: 76 %
Neutro Abs: 7.5 10*3/uL — ABNORMAL HIGH (ref 1.4–6.5)
Platelets: 322 10*3/uL (ref 150–440)
RBC: 4.09 MIL/uL (ref 3.80–5.20)
RDW: 14.3 % (ref 11.5–14.5)
WBC: 9.8 10*3/uL (ref 3.6–11.0)

## 2017-03-29 LAB — COMPREHENSIVE METABOLIC PANEL
ALK PHOS: 106 U/L (ref 38–126)
ALT: 15 U/L (ref 14–54)
ANION GAP: 13 (ref 5–15)
AST: 19 U/L (ref 15–41)
Albumin: 3.1 g/dL — ABNORMAL LOW (ref 3.5–5.0)
BILIRUBIN TOTAL: 0.4 mg/dL (ref 0.3–1.2)
BUN: 10 mg/dL (ref 6–20)
CALCIUM: 9.6 mg/dL (ref 8.9–10.3)
CO2: 21 mmol/L — AB (ref 22–32)
CREATININE: 0.61 mg/dL (ref 0.44–1.00)
Chloride: 105 mmol/L (ref 101–111)
Glucose, Bld: 121 mg/dL — ABNORMAL HIGH (ref 65–99)
Potassium: 3.7 mmol/L (ref 3.5–5.1)
Sodium: 139 mmol/L (ref 135–145)
TOTAL PROTEIN: 6.6 g/dL (ref 6.5–8.1)

## 2017-03-29 LAB — URINALYSIS, COMPLETE (UACMP) WITH MICROSCOPIC
BILIRUBIN URINE: NEGATIVE
Glucose, UA: NEGATIVE mg/dL
Hgb urine dipstick: NEGATIVE
Ketones, ur: NEGATIVE mg/dL
NITRITE: NEGATIVE
PH: 7 (ref 5.0–8.0)
Protein, ur: NEGATIVE mg/dL
SPECIFIC GRAVITY, URINE: 1.013 (ref 1.005–1.030)

## 2017-03-29 MED ORDER — BETAMETHASONE SOD PHOS & ACET 6 (3-3) MG/ML IJ SUSP
12.0000 mg | INTRAMUSCULAR | Status: DC
  Administered 2017-03-29: 12 mg via INTRAMUSCULAR

## 2017-03-29 MED ORDER — TERBUTALINE SULFATE 1 MG/ML IJ SOLN
0.2500 mg | Freq: Once | INTRAMUSCULAR | Status: AC
  Administered 2017-03-29: 0.25 mg via SUBCUTANEOUS
  Filled 2017-03-29: qty 1

## 2017-03-29 NOTE — Progress Notes (Signed)
Kayla Shelton is a 31 y.o. G2P0100 at [redacted]w[redacted]d by dating admitted for Th PTL. Pt is diet controlled diabetic with incompetent cx and cerclage.   Subjective: I am feeling better now  Objective: BP 124/77   Pulse (!) 115   Temp 98.5 F (36.9 C) (Oral)   Resp 18   Ht 5\' 1"  (1.549 m)   Wt 114.3 kg (252 lb)   LMP 08/15/2016 (Approximate)   SpO2 100%   BMI 47.61 kg/m  No intake/output data recorded. No intake/output data recorded.  FHT: 140 , Reactive NST with 12 accels 15 x 15 BPM UC: Irregular SVE:    Not checked as pt has Cerclage  Labs: Lab Results  Component Value Date   WBC 9.8 03/29/2017   HGB 12.4 03/29/2017   HCT 36.4 03/29/2017   MCV 88.9 03/29/2017   PLT 322 03/29/2017    Assessment / Plan: A:1 Th PTL at 32 4/7 weeks 2. Incompetent Cx with Cerclage 3. Diet controlled DM 4. Obesity P:1. NST reactive 2 Terb x 1 dose which stopped uterine irritability 3. Home to rest 4. BMZ x 1 dose and fu in am for 2nd dose. 5. FU at [facility] as scheduled.    [name] 03/29/2017, 1:49 PM

## 2017-03-29 NOTE — H&P (Signed)
Kayla Shelton is a 31 y.o. female presenting for "UC's coming regular this am". OB History    Gravida Para Term Preterm AB Living   2 1   1    0   SAB TAB Ectopic Multiple Live Births         0       Past Medical History:  Diagnosis Date  . Anemia   . Anxiety   . Bladder infection   . Constipation   . Depression   . GERD (gastroesophageal reflux disease)   . Gestational diabetes   . Headache   . Infertility, female   . Kidney stones   . Obesity affecting pregnancy    Past Surgical History:  Procedure Laterality Date  . ADENOIDECTOMY    . CERVICAL CERCLAGE N/A 12/04/2016   Procedure: CERCLAGE CERVICAL;  Surgeon: Boykin Nearing, MD;  Location: [facility] ORS;  Service: Gynecology;  Laterality: N/A;  . DILATATION & CURETTAGE/HYSTEROSCOPY WITH MYOSURE N/A 10/04/2015   Procedure: DILATATION & CURETTAGE/HYSTEROSCOPY WITH MYOSURE, hysteroscopic resection of polyp;  Surgeon: Boykin Nearing, MD;  Location: [facility] ORS;  Service: Gynecology;  Laterality: N/A;  . DILATION AND CURETTAGE OF UTERUS    . DILATION AND EVACUATION N/A 06/25/2015   Procedure: DILATATION AND EVACUATION;  Surgeon: Boykin Nearing, MD;  Location: [facility] ORS;  Service: Gynecology;  Laterality: N/A;  . hematoma on neck removed    . MYRINGOTOMY WITH TUBE PLACEMENT    . TONSILLECTOMY     Family History: family history includes Diabetes in her father, maternal grandfather, and paternal uncle; Hyperlipidemia in her father; Hypertension in her father. Social History:  reports that she has never smoked. She has never used smokeless tobacco. She reports that she does not drink alcohol or use drugs.     Maternal Diabetes: Diet controlled diabetes Genetic Screening: Declined 1st trimester screening Maternal Ultrasounds/Referrals: Normal Fetal Ultrasounds or other Referrals:  Normal Anatomy scans Maternal Substance Abuse:  Neg Significant Maternal Medications:  PNV,  Significant Maternal Lab Results:  None Other  Comments:  None  Review of Systems  Constitutional: Negative.   HENT: Negative.   Eyes: Negative.   Respiratory: Negative.   Gastrointestinal: Negative.   Genitourinary: Negative.   Musculoskeletal: Negative.   Skin: Negative.   Neurological: Negative.   Endo/Heme/Allergies: Negative.   Psychiatric/Behavioral: The patient is nervous/anxious.    History 21 week loss in the past   Blood pressure (!) 138/92, pulse (!) 122, temperature 98.5 F (36.9 C), temperature source Oral, resp. rate 18, height 5\' 1"  (1.549 m), weight 114.3 kg (252 lb), last menstrual period 08/15/2016. Exam Physical Exam  Prenatal labs: ABO, Rh: --/--/A POS (03/23 4656) Antibody: NEG (03/23 0627) Rubella:  Immune RPR:   NR HBsAg:   Neg HIV:   Neg GBS:   Not done yet Varicella:Immune Assessment/Plan: A:1. IUP at 32 4/7 weeks with Th PTL 2. Uterine irritability 3. Gest HTN could be anxiety produced P: PIH panel, P:C ratio,  2. BMZ x 2 (will return tomorrow for 2nd BMZ) 3. Terb 1st dose which resolved Uterine irritability.  4. Controlled Diet with diabetes    [name] 03/29/2017, 11:10 AM

## 2017-03-29 NOTE — Discharge Summary (Signed)
Obstetric Discharge Summary Reason for Admission: Th PTL Prenatal Procedures: [place], Cervical cerclage Intrapartum Procedures: NST, Terb, labs Postpartum Procedures: none Complications-Operative and Postpartum: none Hemoglobin  Date Value Ref Range Status  03/29/2017 12.4 12.0 - 16.0 g/dL Final   HCT  Date Value Ref Range Status  03/29/2017 36.4 35.0 - 47.0 % Final    Physical Exam:  General: alert, cooperative and appears stated age 31: None Uterine Fundus: Gravid Incision: None DVT Evaluation: Neg Homans  Discharge Diagnoses: Th PTL at 32 4/7 weeks  Discharge Information: Date: 03/29/2017 Activity: pelvic rest Diet: routine Medications: PNV Condition: stable Instructions: Continue Diabetic diet and fu with any S/S labor  Discharge to: Home   Newborn Data: This patient has no babies on file.   Kayla Shelton 03/29/2017, 1:37 PM

## 2017-03-30 ENCOUNTER — Inpatient Hospital Stay
Admission: RE | Admit: 2017-03-30 | Discharge: 2017-03-30 | Disposition: A | Discharge status: Home / Self Care | Payer: 59 | Attending: Obstetrics and Gynecology | Admitting: Obstetrics and Gynecology

## 2017-03-30 DIAGNOSIS — Z3A33 33 weeks gestation of pregnancy: Secondary | ICD-10-CM | POA: Diagnosis not present

## 2017-03-30 DIAGNOSIS — O09893 Supervision of other high risk pregnancies, third trimester: Secondary | ICD-10-CM

## 2017-03-30 DIAGNOSIS — O09213 Supervision of pregnancy with history of pre-term labor, third trimester: Secondary | ICD-10-CM

## 2017-03-30 LAB — URINE CULTURE

## 2017-03-30 MED ORDER — BETAMETHASONE SOD PHOS & ACET 6 (3-3) MG/ML IJ SUSP
12.0000 mg | Freq: Once | INTRAMUSCULAR | Status: AC
  Administered 2017-03-30: 12 mg via INTRAMUSCULAR

## 2017-03-30 NOTE — Discharge Summary (Signed)
Pt with hx of preterm previable delivery, here for 2nd betamethasone shot after preterm contractions yesterday. - s/p 2nd shot - d/c home

## 2017-04-23 ENCOUNTER — Inpatient Hospital Stay
Admission: EM | Admit: 2017-04-23 | Discharge: 2017-04-23 | Disposition: A | Discharge status: Home / Self Care | Payer: 59 | Attending: Obstetrics and Gynecology | Admitting: Obstetrics and Gynecology

## 2017-04-23 DIAGNOSIS — Z3A36 36 weeks gestation of pregnancy: Secondary | ICD-10-CM | POA: Insufficient documentation

## 2017-04-23 DIAGNOSIS — O09213 Supervision of pregnancy with history of pre-term labor, third trimester: Secondary | ICD-10-CM | POA: Insufficient documentation

## 2017-04-23 DIAGNOSIS — Z3689 Encounter for other specified antenatal screening: Secondary | ICD-10-CM

## 2017-04-23 NOTE — Progress Notes (Signed)
TRIAGE VISIT with NST   Kayla Shelton is a 31 y.o. G2P0100. She is at [redacted]w[redacted]d gestation, presenting for NST due to previous hx of fetal loss.   Indication: Previous fetal loss  S: Resting comfortably. +CTX, no VB. Active fetal movement.  O:  BP 124/78   Pulse 98   Temp 97.8 F (36.6 C) (Oral)   LMP 08/15/2016 (Approximate)  No results found for this or any previous visit (from the past 48 hour(s)).   Gen: NAD, AAOx3        NST/FHT: Reactive with 2 accels 15 x 15 BPM TOCO: some uterine irritability SVE:  deferred  NST: Reactive. See FHT above for particulars.  A/P:  31 y.o. G2P0100 [redacted]w[redacted]d with previous fetal loss  Labor: not present.   Fetal Wellbeing: NST reactive Reassuring Cat 1 tracing.  D/c home stable, precautions reviewed, follow-up as scheduled.   NST as scheduled. Cerclage intact.

## 2017-04-23 NOTE — OB Triage Note (Signed)
Scheduled NST 

## 2017-04-26 LAB — OB RESULTS CONSOLE GBS: GBS: POSITIVE

## 2017-04-26 LAB — OB RESULTS CONSOLE RPR: RPR: NONREACTIVE

## 2017-04-26 LAB — OB RESULTS CONSOLE GC/CHLAMYDIA
CHLAMYDIA, DNA PROBE: NEGATIVE
Gonorrhea: NEGATIVE

## 2017-04-30 ENCOUNTER — Observation Stay
Admission: EM | Admit: 2017-04-30 | Discharge: 2017-04-30 | Disposition: A | Discharge status: Home / Self Care | Payer: 59 | Attending: Obstetrics & Gynecology | Admitting: Obstetrics & Gynecology

## 2017-04-30 DIAGNOSIS — O0993 Supervision of high risk pregnancy, unspecified, third trimester: Principal | ICD-10-CM | POA: Insufficient documentation

## 2017-04-30 DIAGNOSIS — O24430 Gestational diabetes mellitus in the puerperium, diet controlled: Secondary | ICD-10-CM | POA: Diagnosis present

## 2017-04-30 DIAGNOSIS — O98319 Other infections with a predominantly sexual mode of transmission complicating pregnancy, unspecified trimester: Secondary | ICD-10-CM

## 2017-04-30 DIAGNOSIS — Z3A37 37 weeks gestation of pregnancy: Secondary | ICD-10-CM | POA: Diagnosis not present

## 2017-04-30 DIAGNOSIS — A6009 Herpesviral infection of other urogenital tract: Secondary | ICD-10-CM | POA: Diagnosis present

## 2017-04-30 DIAGNOSIS — Z6841 Body Mass Index (BMI) 40.0 and over, adult: Secondary | ICD-10-CM | POA: Diagnosis not present

## 2017-04-30 DIAGNOSIS — O24419 Gestational diabetes mellitus in pregnancy, unspecified control: Secondary | ICD-10-CM | POA: Insufficient documentation

## 2017-04-30 NOTE — Discharge Summary (Signed)
Kayla Shelton is a 31 y.o. female. She is at [redacted]w[redacted]d gestation. Patient's last menstrual period was 08/15/2016 (approximate). Estimated Date of Delivery: 05/20/17   Prenatal care site: [facility]   Chief Complaint: high risk pregnancy, need for antepartum surveillance, gestational diabetes - diet controlled, morbid obesity  S: Resting comfortably. no CTX, no VB.no LOF,  Active fetal movement.    Maternal Medical History:   Past Medical History:  Diagnosis Date  . Anemia   . Anxiety   . Bladder infection   . Constipation   . Depression   . GERD (gastroesophageal reflux disease)   . Gestational diabetes   . Headache   . Infertility, female   . Kidney stones   . Obesity affecting pregnancy     Past Surgical History:  Procedure Laterality Date  . ADENOIDECTOMY    . CERVICAL CERCLAGE N/A 12/04/2016   Procedure: CERCLAGE CERVICAL;  Surgeon: Boykin Nearing, MD;  Location: [facility];  Service: Gynecology;  Laterality: N/A;  . DILATATION & CURETTAGE/HYSTEROSCOPY WITH MYOSURE N/A 10/04/2015   Procedure: DILATATION & CURETTAGE/HYSTEROSCOPY WITH MYOSURE, hysteroscopic resection of polyp;  Surgeon: Boykin Nearing, MD;  Location: [facility];  Service: Gynecology;  Laterality: N/A;  . DILATION AND CURETTAGE OF UTERUS    . DILATION AND EVACUATION N/A 06/25/2015   Procedure: DILATATION AND EVACUATION;  Surgeon: Boykin Nearing, MD;  Location: [facility];  Service: Gynecology;  Laterality: N/A;  . hematoma on neck removed    . MYRINGOTOMY WITH TUBE PLACEMENT    . TONSILLECTOMY      Allergies  Allergen Reactions  . Sulfa Antibiotics Swelling    Prior to Admission medications   Medication Sig Start Date End Date Taking? Authorizing Provider  acetaminophen (TYLENOL) 500 MG tablet Take 500 mg by mouth every 6 (six) hours as needed (for pain/headache.).     [provider]  calcium carbonate (TUMS - DOSED IN MG ELEMENTAL CALCIUM) 500 MG chewable tablet  Chew 1-2 tablets by mouth 2 (two) times daily as needed for indigestion or heartburn.    [provider]  diphenhydrAMINE (BENADRYL) 50 MG capsule Take 50 mg by mouth every 6 (six) hours as needed (for allergies/itching).     [provider]  docusate sodium (COLACE) 100 MG capsule Take 100 mg by mouth daily as needed for mild constipation.    [provider]  doxylamine, Sleep, (UNISOM) 25 MG tablet Take 12.5 mg by mouth at bedtime as needed.    [provider]  ferrous sulfate 325 (65 FE) MG tablet Take 325 mg by mouth every other day.    [provider]  MAKENA 250 MG/ML OIL injection Inject 250 mg into the muscle once a week.  02/23/17   [provider]  polyethylene glycol (MIRALAX / GLYCOLAX) packet Take 17 g by mouth daily as needed.     [provider]  Prenatal Vit-Fe Fumarate-FA (PRENATAL MULTIVITAMIN) TABS tablet Take 1 tablet by mouth every evening.     [provider]  ranitidine (ZANTAC) 150 MG tablet Take 150 mg by mouth 2 (two) times daily as needed for heartburn.    [provider]     Social History: She  reports that she has never smoked. She has never used smokeless tobacco. She reports that she does not drink alcohol or use drugs.  Family History: family history includes Diabetes in her father, maternal grandfather, and paternal uncle; Hyperlipidemia in her father; Hypertension in her father.  Review of Systems: A full review of systems was performed and negative except as noted in the HPI.     O:  Temp 98 F (36.7 C) (Oral)   Resp 16   Ht 5\' 1"  (1.549 m)   Wt 112.9 kg (249 lb)   LMP 08/15/2016 (Approximate)   BMI 47.05 kg/m  No results found for this or any previous visit (from the past 59 hour(s)).   Constitutional: NAD, AAOx3  HE/ENT: extraocular movements grossly intact, moist mucous membranes CV: RRR PULM: nl respiratory effort, CTABL     Abd: gravid, non-tender,  non-distended, soft      Ext: Non-tender, Nonedmeatous   Psych: mood appropriate, speech normal Pelvic: deferred  Baseline: 135 Variability: moderate Accelerations present x >2 Decelerations absent Time 14mins    A/P: 31 y.o. [redacted]w[redacted]d with high risk pregnancy and antepartum surveillance.   Labor: not present.   Fetal Wellbeing: Reassuring Cat 1 tracing.  Reactive NST   D/c home stable, precautions reviewed, follow-up as scheduled.   ----- Larey Days, MD Attending Obstetrician and Gynecologist [facility], Department of [facility]

## 2017-05-04 ENCOUNTER — Other Ambulatory Visit: Payer: Self-pay | Admitting: Obstetrics and Gynecology

## 2017-05-07 ENCOUNTER — Observation Stay
Admission: EM | Admit: 2017-05-07 | Discharge: 2017-05-07 | Disposition: A | Discharge status: Home / Self Care | Payer: 59 | Attending: Obstetrics and Gynecology | Admitting: Obstetrics and Gynecology

## 2017-05-07 DIAGNOSIS — Z3A38 38 weeks gestation of pregnancy: Secondary | ICD-10-CM | POA: Insufficient documentation

## 2017-05-07 DIAGNOSIS — O368130 Decreased fetal movements, third trimester, not applicable or unspecified: Principal | ICD-10-CM | POA: Insufficient documentation

## 2017-05-07 DIAGNOSIS — O368190 Decreased fetal movements, unspecified trimester, not applicable or unspecified: Secondary | ICD-10-CM | POA: Diagnosis present

## 2017-05-07 NOTE — Progress Notes (Signed)
Patient ID: Kayla Shelton, female   DOB: [birth year], 31 y.o.   MRN: [id number] Pt with decreased fetal movts   since on L+D she has felt baby move No LOF  No vaginal bleeding  O: VSS   NST REACTIVE   uterine irritability   A/P reassured , d/c  Home

## 2017-05-07 NOTE — Discharge Summary (Signed)
  [name], [name], MD  Obstetrics    [] Hide copied text [] Hover for attribution information Patient ID: Kayla Shelton, female   DOB: [birth year], 31 y.o.   MRN: [id number] Pt with decreased fetal movts   since on L+D she has felt baby move No LOF  No vaginal bleeding  O: VSS   NST REACTIVE   uterine irritability   A/P reassured , d/c  Home     Electronically signed by Boykin Nearing, MD at 05/07/2017 3:55 PM

## 2017-05-07 NOTE — OB Triage Note (Signed)
Patient presented to L&D with complaints of decreased fetal movement since last night.  Denies vaginal bleeding or leaking of fluid, no noticeable contractions.  Patient states she was scheduled for an NST today

## 2017-05-12 ENCOUNTER — Inpatient Hospital Stay
Admission: AD | Admit: 2017-05-12 | Discharge: 2017-05-15 | DRG: 775 | Disposition: A | Discharge status: Home / Self Care | Payer: 59 | Source: Ambulatory Visit | Attending: Obstetrics and Gynecology | Admitting: Obstetrics and Gynecology

## 2017-05-12 DIAGNOSIS — O3433 Maternal care for cervical incompetence, third trimester: Secondary | ICD-10-CM | POA: Diagnosis present

## 2017-05-12 DIAGNOSIS — K219 Gastro-esophageal reflux disease without esophagitis: Secondary | ICD-10-CM | POA: Diagnosis present

## 2017-05-12 DIAGNOSIS — O9962 Diseases of the digestive system complicating childbirth: Secondary | ICD-10-CM | POA: Diagnosis present

## 2017-05-12 DIAGNOSIS — O99824 Streptococcus B carrier state complicating childbirth: Secondary | ICD-10-CM | POA: Diagnosis present

## 2017-05-12 DIAGNOSIS — Z3A39 39 weeks gestation of pregnancy: Secondary | ICD-10-CM | POA: Diagnosis not present

## 2017-05-12 DIAGNOSIS — O24419 Gestational diabetes mellitus in pregnancy, unspecified control: Secondary | ICD-10-CM | POA: Diagnosis present

## 2017-05-12 DIAGNOSIS — Z6841 Body Mass Index (BMI) 40.0 and over, adult: Secondary | ICD-10-CM

## 2017-05-12 DIAGNOSIS — O24420 Gestational diabetes mellitus in childbirth, diet controlled: Secondary | ICD-10-CM | POA: Diagnosis present

## 2017-05-12 DIAGNOSIS — O99214 Obesity complicating childbirth: Secondary | ICD-10-CM | POA: Diagnosis present

## 2017-05-12 LAB — CBC
HCT: 38.5 % (ref 35.0–47.0)
Hemoglobin: 13.4 g/dL (ref 12.0–16.0)
MCH: 30.3 pg (ref 26.0–34.0)
MCHC: 34.8 g/dL (ref 32.0–36.0)
MCV: 87.1 fL (ref 80.0–100.0)
Platelets: 264 10*3/uL (ref 150–440)
RBC: 4.42 MIL/uL (ref 3.80–5.20)
RDW: 14.9 % — ABNORMAL HIGH (ref 11.5–14.5)
WBC: 9 10*3/uL (ref 3.6–11.0)

## 2017-05-12 LAB — TYPE AND SCREEN
ABO/RH(D): A POS
ANTIBODY SCREEN: NEGATIVE

## 2017-05-12 LAB — GLUCOSE, RANDOM: Glucose, Bld: 115 mg/dL — ABNORMAL HIGH (ref 65–99)

## 2017-05-12 MED ORDER — LACTATED RINGERS IV SOLN
INTRAVENOUS | Status: DC
  Administered 2017-05-12: 125 mL/h via INTRAVENOUS
  Administered 2017-05-13: 16:00:00 via INTRAVENOUS
  Administered 2017-05-13: 125 mL/h via INTRAVENOUS

## 2017-05-12 MED ORDER — TERBUTALINE SULFATE 1 MG/ML IJ SOLN: 0.2500 mg | Freq: Once | INTRAMUSCULAR | Status: DC | PRN

## 2017-05-12 MED ORDER — SODIUM CHLORIDE 0.9 % IV SOLN
2.0000 g | Freq: Once | INTRAVENOUS | Status: AC
  Administered 2017-05-13: 2 g via INTRAVENOUS
  Filled 2017-05-12: qty 2000

## 2017-05-12 MED ORDER — OXYTOCIN BOLUS FROM INFUSION
500.0000 mL | Freq: Once | INTRAVENOUS | Status: AC
  Administered 2017-05-13: 500 mL via INTRAVENOUS

## 2017-05-12 MED ORDER — LIDOCAINE HCL (PF) 1 % IJ SOLN: 30.0000 mL | INTRAMUSCULAR | Status: DC | PRN

## 2017-05-12 MED ORDER — LACTATED RINGERS IV SOLN
500.0000 mL | INTRAVENOUS | Status: DC | PRN
  Administered 2017-05-13: 500 mL via INTRAVENOUS

## 2017-05-12 MED ORDER — OXYTOCIN 40 UNITS IN LACTATED RINGERS INFUSION - SIMPLE MED
2.5000 [IU]/h | INTRAVENOUS | Status: DC
  Administered 2017-05-13: 2.5 [IU]/h via INTRAVENOUS
  Filled 2017-05-12: qty 1000

## 2017-05-12 MED ORDER — OXYCODONE-ACETAMINOPHEN 5-325 MG PO TABS: 2.0000 | ORAL_TABLET | ORAL | Status: DC | PRN

## 2017-05-12 MED ORDER — OXYCODONE-ACETAMINOPHEN 5-325 MG PO TABS: 1.0000 | ORAL_TABLET | ORAL | Status: DC | PRN

## 2017-05-12 MED ORDER — SOD CITRATE-CITRIC ACID 500-334 MG/5ML PO SOLN
30.0000 mL | ORAL | Status: DC | PRN
  Administered 2017-05-13 (×2): 30 mL via ORAL
  Filled 2017-05-12 (×2): qty 15

## 2017-05-12 MED ORDER — DIPHENHYDRAMINE HCL 25 MG PO CAPS
25.0000 mg | ORAL_CAPSULE | Freq: Four times a day (QID) | ORAL | Status: DC | PRN
  Administered 2017-05-12: 25 mg via ORAL
  Filled 2017-05-12: qty 1

## 2017-05-12 MED ORDER — MISOPROSTOL 25 MCG QUARTER TABLET
25.0000 ug | ORAL_TABLET | ORAL | Status: DC | PRN
  Administered 2017-05-12: 25 ug via VAGINAL
  Filled 2017-05-12: qty 1

## 2017-05-12 MED ORDER — ACETAMINOPHEN 325 MG PO TABS: 650.0000 mg | ORAL_TABLET | ORAL | Status: DC | PRN

## 2017-05-12 MED ORDER — ONDANSETRON HCL 4 MG/2ML IJ SOLN
4.0000 mg | Freq: Four times a day (QID) | INTRAMUSCULAR | Status: DC | PRN
  Administered 2017-05-13: 4 mg via INTRAVENOUS
  Filled 2017-05-12: qty 2

## 2017-05-12 MED ORDER — BUTORPHANOL TARTRATE 1 MG/ML IJ SOLN
1.0000 mg | INTRAMUSCULAR | Status: DC | PRN
  Administered 2017-05-13: 1 mg via INTRAVENOUS
  Filled 2017-05-12: qty 1

## 2017-05-12 NOTE — H&P (Signed)
Kayla Shelton is a 31 y.o. female presenting for IOL due to Gest DM, previous incompetent cx with fetal loss due to severe prematurity, Banjo curettage PP for retained placenta hx, Pt saw [facility] and conceived on Clomid and midcyle Ovidrel EDD is 05/20/17 based on 6 4/7 week [place].Pt received 17 h p for PTD. Cercival cerclage done at 16 weeks. Removed at 36 4/7 weeks.Pt started on Valtrex due to husband having HSV1 and precation to take with no prior hx of HSV type 1.  OB History    Gravida Para Term Preterm AB Living   2 1   1    0   SAB TAB Ectopic Multiple Live Births         0       Past Medical History:  Diagnosis Date  . Anemia   . Anxiety   . Bladder infection   . Constipation   . Depression   . GERD (gastroesophageal reflux disease)   . Gestational diabetes   . Headache   . Infertility, female   . Kidney stones   . Obesity affecting pregnancy    Past Surgical History:  Procedure Laterality Date  . ADENOIDECTOMY    . CERVICAL CERCLAGE N/A 12/04/2016   Procedure: CERCLAGE CERVICAL;  Surgeon: Kayla Nearing, MD;  Location: [facility];  Service: Gynecology;  Laterality: N/A;  . DILATATION & CURETTAGE/HYSTEROSCOPY WITH MYOSURE N/A 10/04/2015   Procedure: DILATATION & CURETTAGE/HYSTEROSCOPY WITH MYOSURE, hysteroscopic resection of polyp;  Surgeon: Kayla Nearing, MD;  Location: [facility];  Service: Gynecology;  Laterality: N/A;  . DILATION AND CURETTAGE OF UTERUS    . DILATION AND EVACUATION N/A 06/25/2015   Procedure: DILATATION AND EVACUATION;  Surgeon: Kayla Nearing, MD;  Location: [facility];  Service: Gynecology;  Laterality: N/A;  . hematoma on neck removed    . MYRINGOTOMY WITH TUBE PLACEMENT    . TONSILLECTOMY     Family History: family history includes Diabetes in her father, maternal grandfather, and paternal uncle; Hyperlipidemia in her father; Hypertension in her father. Social History:  reports that she has never smoked. She has never used smokeless  tobacco. She reports that she does not drink alcohol or use drugs.     Maternal Diabetes:  Genetic Screening:  Maternal Ultrasounds/Referrals:  Fetal Ultrasounds or other Referrals:   Maternal Substance Abuse:   Significant Maternal Medications:   Significant Maternal Lab Results:  Other Comments:   Review of Systems  Constitutional: Negative.   HENT: Negative.   Eyes: Negative.   Respiratory: Negative.   Cardiovascular: Negative.   Gastrointestinal: Negative.   Genitourinary: Negative.   Musculoskeletal: Negative.   Skin: Negative.   Neurological: Negative.   Endo/Heme/Allergies: Negative.   Psychiatric/Behavioral: Negative.    History Dilation: 2 Effacement (%): 80 Station: -3 Exam by:: Kayla Veal, RN Blood pressure 108/79, pulse 96, temperature 98.4 F (36.9 C), temperature source Oral, resp. rate 18, height 5\' 1"  (1.549 m), weight 113.9 kg (251 lb), last menstrual period 08/15/2016, SpO2 99 %. Exam Physical Exam  Gen:31 yo white female in NAD HEENT: Eyes non-icteric. Normocephalic. Lungs: CTA bilat, no W/R/R. CV:S1S2, NO M/R/G.  Abd:EFW 8#8oz Speculum exam done with no lesions or S/S HSV. No cervical or vulvar of vag lesions noted.  Prenatal labs: ABO, Rh: --/--/A POS (08/29 2102) Antibody: NEG (08/29 2102) Rubella: Immune (02/09 0000) RPR: Nonreactive (08/13 0000)  HBsAg: Negative (02/09 0000)  HIV: Non-reactive (02/09 0000)  GBS: Positive (08/13 0000)   Assessment/Plan:  A:1. GDM with diet control with good control 2 Obesity 3. Infertility 4. IOL due to previous hx of fetal demise 5. GBS pos P: Disc IOL including risks of LTCS, fetal or uterine intolerance, risk of bleeding, failed IOL. Pt aware and her husband and her both agree with the plan of care. 2. Will use Cytotec to induce labor. 3. Benadryl for sleep. 4. Ext fetal and uterine monitors. 5. Dr Kayla Shelton plans to delivery pt. He is aware of her admission and has given consent for Cytotec. 6.  Will give Antibiotics when labor starts.     Kayla Shelton 05/12/2017, 10:40 PM

## 2017-05-13 ENCOUNTER — Encounter: Payer: Self-pay | Admitting: Anesthesiology

## 2017-05-13 ENCOUNTER — Inpatient Hospital Stay: Payer: 59 | Admitting: Anesthesiology

## 2017-05-13 LAB — GLUCOSE, CAPILLARY
GLUCOSE-CAPILLARY: 94 mg/dL (ref 65–99)
GLUCOSE-CAPILLARY: 84 mg/dL (ref 65–99)
GLUCOSE-CAPILLARY: 96 mg/dL (ref 65–99)
GLUCOSE-CAPILLARY: 78 mg/dL (ref 65–99)
Glucose-Capillary: 73 mg/dL (ref 65–99)

## 2017-05-13 MED ORDER — LIDOCAINE HCL (PF) 1 % IJ SOLN
INTRAMUSCULAR | Status: AC
  Filled 2017-05-13: qty 30

## 2017-05-13 MED ORDER — FAMOTIDINE 20 MG PO TABS
20.0000 mg | ORAL_TABLET | Freq: Every day | ORAL | Status: DC
  Administered 2017-05-14 – 2017-05-15 (×3): 20 mg via ORAL
  Filled 2017-05-13 (×3): qty 1

## 2017-05-13 MED ORDER — IBUPROFEN 600 MG PO TABS
600.0000 mg | ORAL_TABLET | Freq: Four times a day (QID) | ORAL | Status: DC
  Administered 2017-05-14 – 2017-05-15 (×6): 600 mg via ORAL
  Filled 2017-05-13 (×6): qty 1

## 2017-05-13 MED ORDER — LACTATED RINGERS IV SOLN: 500.0000 mL | Freq: Once | INTRAVENOUS | Status: DC

## 2017-05-13 MED ORDER — OXYTOCIN 10 UNIT/ML IJ SOLN
INTRAMUSCULAR | Status: AC
  Filled 2017-05-13: qty 2

## 2017-05-13 MED ORDER — MAGNESIUM HYDROXIDE 400 MG/5ML PO SUSP: 30.0000 mL | ORAL | Status: DC | PRN

## 2017-05-13 MED ORDER — ACETAMINOPHEN 325 MG PO TABS: 650.0000 mg | ORAL_TABLET | ORAL | Status: DC | PRN

## 2017-05-13 MED ORDER — PHENYLEPHRINE 40 MCG/ML (10ML) SYRINGE FOR IV PUSH (FOR BLOOD PRESSURE SUPPORT)
80.0000 ug | PREFILLED_SYRINGE | INTRAVENOUS | Status: DC | PRN
  Filled 2017-05-13: qty 5

## 2017-05-13 MED ORDER — DIPHENHYDRAMINE HCL 25 MG PO CAPS: 25.0000 mg | ORAL_CAPSULE | Freq: Four times a day (QID) | ORAL | Status: DC | PRN

## 2017-05-13 MED ORDER — EPHEDRINE 5 MG/ML INJ
10.0000 mg | INTRAVENOUS | Status: DC | PRN
  Filled 2017-05-13: qty 2

## 2017-05-13 MED ORDER — OXYTOCIN 40 UNITS IN LACTATED RINGERS INFUSION - SIMPLE MED
1.0000 m[IU]/min | INTRAVENOUS | Status: DC
  Administered 2017-05-13: 1 m[IU]/min via INTRAVENOUS

## 2017-05-13 MED ORDER — ALUM & MAG HYDROXIDE-SIMETH 200-200-20 MG/5ML PO SUSP
ORAL | Status: AC
  Administered 2017-05-13: 30 mL via ORAL
  Filled 2017-05-13: qty 30

## 2017-05-13 MED ORDER — BUPIVACAINE HCL (PF) 0.25 % IJ SOLN
INTRAMUSCULAR | Status: DC | PRN
  Administered 2017-05-13: 5 mL via EPIDURAL

## 2017-05-13 MED ORDER — BENZOCAINE-MENTHOL 20-0.5 % EX AERO
1.0000 "application " | INHALATION_SPRAY | CUTANEOUS | Status: DC | PRN
  Filled 2017-05-13: qty 56

## 2017-05-13 MED ORDER — FENTANYL CITRATE (PF) 100 MCG/2ML IJ SOLN
50.0000 ug | Freq: Once | INTRAMUSCULAR | Status: AC
  Administered 2017-05-13: 50 ug via INTRAVENOUS
  Filled 2017-05-13: qty 2

## 2017-05-13 MED ORDER — FERROUS SULFATE 325 (65 FE) MG PO TABS
325.0000 mg | ORAL_TABLET | Freq: Two times a day (BID) | ORAL | Status: DC
  Administered 2017-05-14 – 2017-05-15 (×3): 325 mg via ORAL
  Filled 2017-05-13 (×3): qty 1

## 2017-05-13 MED ORDER — ONDANSETRON HCL 4 MG/2ML IJ SOLN: 4.0000 mg | INTRAMUSCULAR | Status: DC | PRN

## 2017-05-13 MED ORDER — WITCH HAZEL-GLYCERIN EX PADS: 1.0000 "application " | MEDICATED_PAD | CUTANEOUS | Status: DC | PRN

## 2017-05-13 MED ORDER — HYDROCODONE-ACETAMINOPHEN 5-325 MG PO TABS: 1.0000 | ORAL_TABLET | ORAL | Status: DC | PRN

## 2017-05-13 MED ORDER — SODIUM CHLORIDE 0.9 % IV SOLN
1.0000 g | INTRAVENOUS | Status: DC
  Administered 2017-05-13 (×4): 1 g via INTRAVENOUS
  Filled 2017-05-13 (×8): qty 1000

## 2017-05-13 MED ORDER — FAMOTIDINE IN NACL 20-0.9 MG/50ML-% IV SOLN
INTRAVENOUS | Status: AC
  Administered 2017-05-13: 20 mg via INTRAVENOUS
  Filled 2017-05-13: qty 50

## 2017-05-13 MED ORDER — AMMONIA AROMATIC IN INHA
RESPIRATORY_TRACT | Status: AC
  Filled 2017-05-13: qty 10

## 2017-05-13 MED ORDER — FAMOTIDINE IN NACL 20-0.9 MG/50ML-% IV SOLN
20.0000 mg | Freq: Once | INTRAVENOUS | Status: AC
  Administered 2017-05-13: 20 mg via INTRAVENOUS

## 2017-05-13 MED ORDER — BENZOCAINE-MENTHOL 20-0.5 % EX AERO
INHALATION_SPRAY | CUTANEOUS | Status: AC
  Filled 2017-05-13: qty 56

## 2017-05-13 MED ORDER — PRENATAL MULTIVITAMIN CH
1.0000 | ORAL_TABLET | Freq: Every day | ORAL | Status: DC
  Administered 2017-05-14: 1 via ORAL
  Filled 2017-05-13: qty 1

## 2017-05-13 MED ORDER — FENTANYL 2.5 MCG/ML W/ROPIVACAINE 0.15% IN NS 100 ML EPIDURAL (ARMC)
12.0000 mL/h | EPIDURAL | Status: DC
  Administered 2017-05-13: 10 mL/h via EPIDURAL
  Filled 2017-05-13: qty 100

## 2017-05-13 MED ORDER — ALUM & MAG HYDROXIDE-SIMETH 200-200-20 MG/5ML PO SUSP
30.0000 mL | ORAL | Status: DC | PRN
  Administered 2017-05-13: 30 mL via ORAL

## 2017-05-13 MED ORDER — ZOLPIDEM TARTRATE 5 MG PO TABS: 5.0000 mg | ORAL_TABLET | Freq: Every evening | ORAL | Status: DC | PRN

## 2017-05-13 MED ORDER — DIBUCAINE 1 % RE OINT: 1.0000 "application " | TOPICAL_OINTMENT | RECTAL | Status: DC | PRN

## 2017-05-13 MED ORDER — MISOPROSTOL 200 MCG PO TABS
ORAL_TABLET | ORAL | Status: AC
  Filled 2017-05-13: qty 4

## 2017-05-13 MED ORDER — MEASLES, MUMPS & RUBELLA VAC ~~LOC~~ INJ
0.5000 mL | INJECTION | Freq: Once | SUBCUTANEOUS | Status: DC
  Filled 2017-05-13: qty 0.5

## 2017-05-13 MED ORDER — ONDANSETRON HCL 4 MG PO TABS: 4.0000 mg | ORAL_TABLET | ORAL | Status: DC | PRN

## 2017-05-13 MED ORDER — DIPHENHYDRAMINE HCL 50 MG/ML IJ SOLN: 12.5000 mg | INTRAMUSCULAR | Status: DC | PRN

## 2017-05-13 MED ORDER — SENNOSIDES-DOCUSATE SODIUM 8.6-50 MG PO TABS
2.0000 | ORAL_TABLET | ORAL | Status: DC
  Administered 2017-05-14 – 2017-05-15 (×2): 2 via ORAL
  Filled 2017-05-13 (×2): qty 2

## 2017-05-13 MED ORDER — SIMETHICONE 80 MG PO CHEW: 80.0000 mg | CHEWABLE_TABLET | ORAL | Status: DC | PRN

## 2017-05-13 MED ORDER — FENTANYL 2.5 MCG/ML W/ROPIVACAINE 0.15% IN NS 100 ML EPIDURAL (ARMC)
EPIDURAL | Status: AC
  Filled 2017-05-13: qty 100

## 2017-05-13 MED ORDER — COCONUT OIL OIL
1.0000 "application " | TOPICAL_OIL | Status: DC | PRN
  Administered 2017-05-14: 1 via TOPICAL
  Filled 2017-05-13: qty 120

## 2017-05-13 NOTE — Progress Notes (Signed)
Patient ID: Kayla Shelton, female   DOB: Nov 06, 1985, 31 y.o.   MRN: [id number] Pt now pushing adequately  pepcid 20 IV written for GERD  Reassuring fetal monitoring

## 2017-05-13 NOTE — Progress Notes (Signed)
Patient ID: Kayla Shelton, female   DOB: 1986-01-15, 31 y.o.   MRN: [id number] cx : 8 cm / c/ 0 to +1  Bloody show  Reassuring fetal monitoring . Adequate ctx pattern on Pitocin

## 2017-05-13 NOTE — Progress Notes (Signed)
Kayla Shelton is a 31 y.o. G2P0100 at [redacted]w[redacted]d admitted for IOL for previous fetal demise, cx incompetence with cerclage and removal and GDM.  Subjective: I plan on Nitrous Oxide  Objective: BP 119/82   Pulse 81   Temp 98.3 F (36.8 C) (Axillary)   Resp 18   Ht 5\' 1"  (1.549 m)   Wt 113.9 kg (251 lb)   LMP 08/15/2016 (Approximate)   SpO2 99%   BMI 47.43 kg/m  No intake/output data recorded. No intake/output data recorded.  FHT:140, occas variable noted, Cat 2 with mod variability UC:  q 2 mins, IUPC inserted and monitoring well. 60 mm noted SVE:   4/100/vtx-2 AROM performed with clear fluid mod amt. IFSE attempted but not recording well. Removed Labs: Lab Results  Component Value Date   WBC 9.0 05/12/2017   HGB 13.4 05/12/2017   HCT 38.5 05/12/2017   MCV 87.1 05/12/2017   PLT 264 05/12/2017    Assessment / Plan: A:1. IUP at term 2. GDM diet controlled 3. Previous fetal loss at 21 weeks 4. Cx incompetence with cerclage P:1. Continue to monitor UC/FHT's 2. Plan Nitrous Oxide for pain control 3. Dr Ouida Sills to cover pt   [name] 05/13/2017, 8:17 AM

## 2017-05-13 NOTE — Progress Notes (Signed)
Patient ID: Kayla Shelton, female   DOB: Aug 24, 1986, 31 y.o.   MRN: [id number]

## 2017-05-13 NOTE — Discharge Summary (Signed)
Obstetric Discharge Summary Reason for Admission: induction of labor GDM  A1 39+0 Prenatal Procedures: cerclage Intrapartum Procedures: spontaneous vaginal delivery Postpartum Procedures: none Complications-Operative and Postpartum: none Hemoglobin  Date Value Ref Range Status  05/12/2017 13.4 12.0 - 16.0 g/dL Final   HCT  Date Value Ref Range Status  05/12/2017 38.5 35.0 - 47.0 % Final    Physical Exam:  General: alert, cooperative and no distress Lochia: appropriate Uterine Fundus: firm IDVT Evaluation: No evidence of DVT seen on physical exam.  Discharge Diagnoses: Term Pregnancy-delivered and Incompetent cervix A1 GDM , incompetent cervix and cerclage placement at 36+ weeks  Discharge Information: Date: 05/13/2017 Activity: unrestricted Diet: routine Medications: PNV and Colace Condition: stable Discharge to: home   Newborn Data: Live born female  Birth Weight: 8 lb 1.5 oz (3670 g) APGAR: 8, 9 At 8:28 on 8/3/0/18 PM a viable female was delivered via Vaginal, Spontaneous Delivery (Presentation: vtx LOA;  ).  APGAR: 8, 9; weight 8 lb 1.5 oz (3670 g).   Placenta status intact : , .  Cord:3 v delayed cord clamping   with the following complications: .None  Cord pH: not done  Anesthesia:  cle Episiotomy: None Lacerations: 2nd degree Suture Repair: 2.0 3.0 vicryl Est. Blood Loss (mL):  300 cc Home with mother.  Prenatal labs: ABO, Rh: --/--/A POS (08/29 2102) Antibody: NEG (08/29 2102) Rubella: Immune (02/09 0000) RPR: Nonreactive (08/13 0000)  HBsAg: Negative (02/09 0000)  HIV: Non-reactive (02/09 0000)  GBS: Positive (08/13 0000)   Kayla Shelton 05/13/2017, 9:07 PM

## 2017-05-13 NOTE — Anesthesia Procedure Notes (Signed)
Epidural Patient location during procedure: OB  Staffing Anesthesiologist: Gunnar Bulla Performed: anesthesiologist   Preanesthetic Checklist Completed: patient identified, site marked, surgical consent, pre-op evaluation, timeout performed, IV checked, risks and benefits discussed and monitors and equipment checked  Epidural Patient position: sitting Prep: Betadine Patient monitoring: heart rate, continuous pulse ox and blood pressure Approach: midline Location: L4-L5 Injection technique: LOR saline  Needle:  Needle type: Tuohy  Needle gauge: 18 G Needle length: 9 cm and 9 Catheter type: closed end flexible Catheter size: 20 Guage Test dose: negative and 1.5% lidocaine with Epi 1:200 K  Assessment Sensory level: T10 Events: blood not aspirated, injection not painful, no injection resistance, negative IV test and no paresthesia  Additional Notes   Patient tolerated the insertion well without complications.Reason for block:procedure for pain

## 2017-05-13 NOTE — Anesthesia Preprocedure Evaluation (Signed)
Anesthesia Evaluation  Patient identified by MRN, date of birth, ID band Patient awake    Reviewed: Allergy & Precautions, NPO status , Patient's Chart, lab work & pertinent test results, reviewed documented beta blocker date and time   Airway Mallampati: III  TM Distance: >3 FB     Dental  (+) Chipped   Pulmonary           Cardiovascular      Neuro/Psych  Headaches, PSYCHIATRIC DISORDERS Anxiety Depression    GI/Hepatic GERD  Controlled,  Endo/Other  diabetes, Type 2Morbid obesity  Renal/GU Renal disease     Musculoskeletal   Abdominal   Peds  Hematology  (+) anemia ,   Anesthesia Other Findings   Reproductive/Obstetrics                             Anesthesia Physical Anesthesia Plan  ASA: III  Anesthesia Plan: Epidural   Post-op Pain Management:    Induction:   PONV Risk Score and Plan:   Airway Management Planned:   Additional Equipment:   Intra-op Plan:   Post-operative Plan:   Informed Consent: I have reviewed the patients History and Physical, chart, labs and discussed the procedure including the risks, benefits and alternatives for the proposed anesthesia with the patient or authorized representative who has indicated his/her understanding and acceptance.     Plan Discussed with: CRNA  Anesthesia Plan Comments:         Anesthesia Quick Evaluation

## 2017-05-13 NOTE — Progress Notes (Signed)
Patient ID: Kayla Shelton, female   DOB: 01-26-1986, 31 y.o.   MRN: [id number] Pit on 11 mu/min  Adequate ctx  Pt with CLE working well  Cx 6 cm/ 90/-1 -2  By RN  Reassuring fetal monitoring . A few variables  Cont monitoring

## 2017-05-14 LAB — CBC
HCT: 31.6 % — ABNORMAL LOW (ref 35.0–47.0)
HEMOGLOBIN: 11.1 g/dL — AB (ref 12.0–16.0)
MCH: 30.3 pg (ref 26.0–34.0)
MCHC: 35.1 g/dL (ref 32.0–36.0)
MCV: 86.1 fL (ref 80.0–100.0)
Platelets: 232 10*3/uL (ref 150–440)
RBC: 3.67 MIL/uL — ABNORMAL LOW (ref 3.80–5.20)
RDW: 14.5 % (ref 11.5–14.5)
WBC: 15.1 10*3/uL — ABNORMAL HIGH (ref 3.6–11.0)

## 2017-05-14 LAB — RPR: RPR: NONREACTIVE

## 2017-05-14 MED ORDER — DIBUCAINE 1 % RE OINT
1.0000 "application " | TOPICAL_OINTMENT | RECTAL | Status: DC | PRN
  Administered 2017-05-14: 1 via RECTAL
  Filled 2017-05-14 (×2): qty 28

## 2017-05-14 MED ORDER — WITCH HAZEL-GLYCERIN EX PADS
1.0000 "application " | MEDICATED_PAD | CUTANEOUS | Status: DC
  Administered 2017-05-14: 1 via TOPICAL
  Filled 2017-05-14 (×2): qty 100

## 2017-05-14 NOTE — Progress Notes (Signed)
Post Partum Day 1 Subjective: Doing well, no complaints.  Tolerating regular diet, pain with PO meds, voiding and ambulating without difficulty.  No CP SOB F/C N/V or leg pain   Objective: BP 103/62 (BP Location: Right Arm)   Pulse 90   Temp 97.8 F (36.6 C) (Oral)   Resp 17   Ht 5\' 1"  (1.549 m)   Wt 251 lb (113.9 kg)   LMP 08/15/2016 (Approximate)   SpO2 100%   Breastfeeding? Unknown   BMI 47.43 kg/m    Physical Exam:  General: NAD CV: RRR Pulm: nl effort, CTABL Lochia: moderate Uterine Fundus: fundus firm and below umbilicus DVT Evaluation: no cords, ttp LEs    Recent Labs  05/12/17 2014 05/14/17 0456  HGB 13.4 11.1*  HCT 38.5 31.6*  WBC 9.0 15.1*  PLT 264 232    Assessment/Plan: 31 y.o. G2P1101 postpartum day # 1  1. Routine post partum cares 2. Anticipate d/c tomorrow  ----- Larey Days, MD Attending Obstetrician and Gynecologist [facility], Department of OB/GYN [facility]     ----- Larey Days, MD Attending Obstetrician and Gynecologist [facility]

## 2017-05-14 NOTE — Plan of Care (Signed)
Problem: Nutritional: Goal: Dietary intake will improve Outcome: Progressing Pt on regular diet  Problem: Pain Management: Goal: General experience of comfort will improve and pain level will decrease Outcome: Progressing Pt denies pain

## 2017-05-14 NOTE — Anesthesia Postprocedure Evaluation (Signed)
Anesthesia Post Note  Patient: Kayla Shelton  Procedure(s) Performed: * No procedures listed *  Patient location during evaluation: Mother Baby Anesthesia Type: Epidural Level of consciousness: awake and alert Pain management: pain level controlled Vital Signs Assessment: post-procedure vital signs reviewed and stable Respiratory status: spontaneous breathing, nonlabored ventilation and respiratory function stable Cardiovascular status: stable Postop Assessment: no headache, no backache, epidural receding, no signs of nausea or vomiting and adequate PO intake Anesthetic complications: no     Last Vitals:  Vitals:   05/14/17 0500 05/14/17 0738  BP: (!) 105/51 109/74  Pulse:  80  Resp:  18  Temp:  36.8 C  SpO2:  100%    Last Pain:  Vitals:   05/14/17 0738  TempSrc: Oral  PainSc:                  Kayla Shelton

## 2017-05-14 NOTE — Plan of Care (Signed)
Problem: Life Cycle: Goal: Risk for postpartum hemorrhage will decrease Outcome: Progressing Firm, Midline, U/1

## 2017-05-15 NOTE — Discharge Instructions (Signed)

## 2017-05-15 NOTE — Progress Notes (Signed)
Discharge instructions given. Patient verbalizes understanding of teaching. Patient discharged home at 1215.

## 2018-07-26 ENCOUNTER — Other Ambulatory Visit: Payer: Self-pay | Admitting: Family Medicine

## 2018-07-26 ENCOUNTER — Ambulatory Visit
Admission: RE | Admit: 2018-07-26 | Discharge: 2018-07-26 | Disposition: A | Discharge status: Home / Self Care | Payer: 59 | Source: Ambulatory Visit | Attending: Family Medicine | Admitting: Family Medicine

## 2018-07-26 DIAGNOSIS — R519 Headache, unspecified: Secondary | ICD-10-CM

## 2018-07-26 DIAGNOSIS — W19XXXA Unspecified fall, initial encounter: Secondary | ICD-10-CM

## 2018-07-26 DIAGNOSIS — R55 Syncope and collapse: Secondary | ICD-10-CM

## 2018-07-26 DIAGNOSIS — J329 Chronic sinusitis, unspecified: Secondary | ICD-10-CM | POA: Diagnosis not present

## 2018-07-26 DIAGNOSIS — B9689 Other specified bacterial agents as the cause of diseases classified elsewhere: Secondary | ICD-10-CM | POA: Insufficient documentation

## 2018-07-26 DIAGNOSIS — R51 Headache: Secondary | ICD-10-CM | POA: Diagnosis not present

## 2018-08-19 ENCOUNTER — Other Ambulatory Visit: Payer: Self-pay | Admitting: Family Medicine

## 2018-08-19 ENCOUNTER — Other Ambulatory Visit
Admission: RE | Admit: 2018-08-19 | Discharge: 2018-08-19 | Disposition: A | Discharge status: Home / Self Care | Payer: Managed Care, Other (non HMO) | Source: Ambulatory Visit | Attending: *Deleted | Admitting: *Deleted

## 2018-08-19 ENCOUNTER — Ambulatory Visit
Admission: RE | Admit: 2018-08-19 | Discharge: 2018-08-19 | Disposition: A | Discharge status: Home / Self Care | Payer: Managed Care, Other (non HMO) | Source: Ambulatory Visit | Attending: Family Medicine | Admitting: Family Medicine

## 2018-08-19 DIAGNOSIS — R0789 Other chest pain: Secondary | ICD-10-CM | POA: Insufficient documentation

## 2018-08-19 DIAGNOSIS — R079 Chest pain, unspecified: Secondary | ICD-10-CM | POA: Insufficient documentation

## 2018-08-19 DIAGNOSIS — R7989 Other specified abnormal findings of blood chemistry: Secondary | ICD-10-CM

## 2018-08-19 LAB — FIBRIN DERIVATIVES D-DIMER (ARMC ONLY): Fibrin derivatives D-dimer (ARMC): 1217.21 ng/mL (FEU) — ABNORMAL HIGH (ref 0.00–499.00)

## 2018-08-19 MED ORDER — IOHEXOL 350 MG/ML SOLN
75.0000 mL | Freq: Once | INTRAVENOUS | Status: AC | PRN
  Administered 2018-08-19: 75 mL via INTRAVENOUS

## 2020-10-01 ENCOUNTER — Ambulatory Visit: Payer: Self-pay

## 2022-03-03 ENCOUNTER — Encounter: Payer: Self-pay | Admitting: Internal Medicine

## 2022-03-03 ENCOUNTER — Other Ambulatory Visit: Payer: Self-pay

## 2022-03-03 MED ORDER — WEGOVY 1 MG/0.5ML ~~LOC~~ SOAJ
SUBCUTANEOUS | 0 refills | Status: AC
  Filled 2022-03-03: qty 2, 30d supply, fill #0

## 2022-03-04 ENCOUNTER — Encounter: Payer: Self-pay | Admitting: Internal Medicine

## 2022-03-04 ENCOUNTER — Other Ambulatory Visit: Payer: Self-pay

## 2023-02-05 ENCOUNTER — Other Ambulatory Visit: Payer: Self-pay

## 2023-02-05 MED ORDER — ZEPBOUND 5 MG/0.5ML ~~LOC~~ SOAJ
5.0000 mg | SUBCUTANEOUS | 0 refills | Status: DC
  Filled 2023-02-05: qty 2, 28d supply, fill #0

## 2023-02-10 ENCOUNTER — Other Ambulatory Visit: Payer: Self-pay

## 2023-02-10 MED ORDER — ZEPBOUND 5 MG/0.5ML ~~LOC~~ SOAJ
5.0000 mg | SUBCUTANEOUS | 0 refills | Status: DC
  Filled 2023-02-10: qty 2, 28d supply, fill #0

## 2023-02-10 MED ORDER — CIPROFLOXACIN-DEXAMETHASONE 0.3-0.1 % OT SUSP
4.0000 [drp] | Freq: Two times a day (BID) | OTIC | 0 refills | Status: AC
  Filled 2023-02-10: qty 7.5, 19d supply, fill #0

## 2023-02-24 ENCOUNTER — Other Ambulatory Visit: Payer: Self-pay

## 2023-02-24 MED ORDER — ZEPBOUND 7.5 MG/0.5ML ~~LOC~~ SOAJ
7.5000 mg | SUBCUTANEOUS | 0 refills | Status: DC
  Filled 2023-02-24 – 2023-03-04 (×2): qty 2, 28d supply, fill #0

## 2023-03-04 ENCOUNTER — Other Ambulatory Visit: Payer: Self-pay

## 2023-03-05 ENCOUNTER — Other Ambulatory Visit: Payer: Self-pay

## 2023-03-08 ENCOUNTER — Other Ambulatory Visit: Payer: Self-pay

## 2023-03-31 ENCOUNTER — Other Ambulatory Visit: Payer: Self-pay

## 2023-03-31 MED ORDER — ZEPBOUND 7.5 MG/0.5ML ~~LOC~~ SOAJ
7.5000 mg | SUBCUTANEOUS | 0 refills | Status: DC
  Filled 2023-03-31: qty 2, 28d supply, fill #0

## 2023-05-03 ENCOUNTER — Other Ambulatory Visit: Payer: Self-pay

## 2023-05-03 MED ORDER — ZEPBOUND 7.5 MG/0.5ML ~~LOC~~ SOAJ
7.5000 mg | SUBCUTANEOUS | 0 refills | Status: DC
  Filled 2023-05-03: qty 2, 28d supply, fill #0

## 2023-05-31 ENCOUNTER — Other Ambulatory Visit: Payer: Self-pay

## 2023-05-31 MED ORDER — ZEPBOUND 7.5 MG/0.5ML ~~LOC~~ SOAJ
SUBCUTANEOUS | 0 refills | Status: DC
  Filled 2023-05-31: qty 2, 28d supply, fill #0

## 2023-06-28 ENCOUNTER — Other Ambulatory Visit: Payer: Self-pay

## 2023-06-28 MED ORDER — ZEPBOUND 5 MG/0.5ML ~~LOC~~ SOAJ
5.0000 mg | SUBCUTANEOUS | 0 refills | Status: AC
  Filled 2023-06-28: qty 2, 28d supply, fill #0

## 2024-08-31 ENCOUNTER — Other Ambulatory Visit: Payer: Self-pay | Admitting: Family Medicine

## 2024-08-31 DIAGNOSIS — R10A2 Flank pain, left side: Secondary | ICD-10-CM

## 2024-08-31 DIAGNOSIS — R102 Pelvic and perineal pain unspecified side: Secondary | ICD-10-CM
